# Patient Record
Sex: Male | Born: 1940 | Race: Black or African American | Hispanic: No | Marital: Married | Smoking: Former smoker
Health system: Southern US, Community
[De-identification: ages and names within clinical notes are randomized; demographics above are authoritative.]

---

## 2011-11-23 ENCOUNTER — Emergency Department: Payer: Self-pay | Admitting: Emergency Medicine

## 2013-01-06 ENCOUNTER — Emergency Department: Payer: Self-pay | Admitting: Emergency Medicine

## 2014-03-23 ENCOUNTER — Emergency Department: Payer: Self-pay | Admitting: Emergency Medicine

## 2016-01-12 ENCOUNTER — Emergency Department: Payer: Medicare Other

## 2016-01-12 ENCOUNTER — Encounter: Payer: Self-pay | Admitting: Emergency Medicine

## 2016-01-12 ENCOUNTER — Emergency Department
Admission: EM | Admit: 2016-01-12 | Discharge: 2016-01-12 | Disposition: A | Discharge status: Home / Self Care | Payer: Medicare Other | Attending: Student | Admitting: Student

## 2016-01-12 DIAGNOSIS — Z5181 Encounter for therapeutic drug level monitoring: Secondary | ICD-10-CM | POA: Insufficient documentation

## 2016-01-12 DIAGNOSIS — Z87891 Personal history of nicotine dependence: Secondary | ICD-10-CM | POA: Insufficient documentation

## 2016-01-12 DIAGNOSIS — R627 Adult failure to thrive: Secondary | ICD-10-CM | POA: Diagnosis present

## 2016-01-12 DIAGNOSIS — F1010 Alcohol abuse, uncomplicated: Secondary | ICD-10-CM | POA: Diagnosis not present

## 2016-01-12 LAB — CBC WITH DIFFERENTIAL/PLATELET
Basophils Absolute: 0 10*3/uL (ref 0–0.1)
Basophils Relative: 1 %
EOS PCT: 2 %
Eosinophils Absolute: 0.1 10*3/uL (ref 0–0.7)
HEMATOCRIT: 36.6 % — AB (ref 40.0–52.0)
HEMOGLOBIN: 12.8 g/dL — AB (ref 13.0–18.0)
Lymphocytes Relative: 12 %
Lymphs Abs: 1 10*3/uL (ref 1.0–3.6)
MCH: 35.2 pg — ABNORMAL HIGH (ref 26.0–34.0)
MCHC: 35 g/dL (ref 32.0–36.0)
MCV: 100.6 fL — AB (ref 80.0–100.0)
MONO ABS: 1 10*3/uL (ref 0.2–1.0)
Monocytes Relative: 12 %
Neutro Abs: 6.5 10*3/uL (ref 1.4–6.5)
Neutrophils Relative %: 75 %
Platelets: 222 10*3/uL (ref 150–440)
RBC: 3.64 MIL/uL — ABNORMAL LOW (ref 4.40–5.90)
RDW: 15.4 % — AB (ref 11.5–14.5)
WBC: 8.7 10*3/uL (ref 3.8–10.6)

## 2016-01-12 LAB — URINALYSIS COMPLETE WITH MICROSCOPIC (ARMC ONLY)
Bacteria, UA: NONE SEEN
Bilirubin Urine: NEGATIVE
Glucose, UA: NEGATIVE mg/dL
Ketones, ur: NEGATIVE mg/dL
LEUKOCYTES UA: NEGATIVE
Nitrite: NEGATIVE
PH: 5 (ref 5.0–8.0)
PROTEIN: NEGATIVE mg/dL
RBC / HPF: NONE SEEN RBC/hpf (ref 0–5)
SPECIFIC GRAVITY, URINE: 1.002 — AB (ref 1.005–1.030)
WBC UA: NONE SEEN WBC/hpf (ref 0–5)

## 2016-01-12 LAB — URINE DRUG SCREEN, QUALITATIVE (ARMC ONLY)
Amphetamines, Ur Screen: NOT DETECTED
BENZODIAZEPINE, UR SCRN: NOT DETECTED
Barbiturates, Ur Screen: NOT DETECTED
CANNABINOID 50 NG, UR ~~LOC~~: NOT DETECTED
Cocaine Metabolite,Ur ~~LOC~~: NOT DETECTED
MDMA (ECSTASY) UR SCREEN: NOT DETECTED
Methadone Scn, Ur: NOT DETECTED
Opiate, Ur Screen: NOT DETECTED
PHENCYCLIDINE (PCP) UR S: NOT DETECTED
TRICYCLIC, UR SCREEN: NOT DETECTED

## 2016-01-12 LAB — COMPREHENSIVE METABOLIC PANEL
ALT: 46 U/L (ref 17–63)
AST: 114 U/L — AB (ref 15–41)
Albumin: 4.3 g/dL (ref 3.5–5.0)
Alkaline Phosphatase: 100 U/L (ref 38–126)
Anion gap: 11 (ref 5–15)
BILIRUBIN TOTAL: 0.8 mg/dL (ref 0.3–1.2)
BUN: 6 mg/dL (ref 6–20)
CO2: 28 mmol/L (ref 22–32)
CREATININE: 1.05 mg/dL (ref 0.61–1.24)
Calcium: 9 mg/dL (ref 8.9–10.3)
Chloride: 92 mmol/L — ABNORMAL LOW (ref 101–111)
GFR calc Af Amer: >60 mL/min (ref >60)
GFR calc non Af Amer: >60 mL/min (ref >60)
Glucose, Bld: 84 mg/dL (ref 65–99)
POTASSIUM: 3.6 mmol/L (ref 3.5–5.1)
Sodium: 131 mmol/L — ABNORMAL LOW (ref 135–145)
TOTAL PROTEIN: 8.3 g/dL — AB (ref 6.5–8.1)

## 2016-01-12 LAB — ETHANOL: ALCOHOL ETHYL (B): 252 mg/dL — AB (ref <5)

## 2016-01-12 LAB — SALICYLATE LEVEL: Salicylate Lvl: <4 mg/dL (ref 2.8–30.0)

## 2016-01-12 LAB — ACETAMINOPHEN LEVEL: Acetaminophen (Tylenol), Serum: <10 ug/mL — ABNORMAL LOW (ref 10–30)

## 2016-01-12 NOTE — ED Notes (Signed)
Pt tolerated suctioning

## 2016-01-12 NOTE — Progress Notes (Signed)
Pt. Removed his trach. Per Dr. Joya SalmGayle's verbal order another #6 cuffless Shiley was inserted into his stoma. He was educated on the importance of keeping the trach in.
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
## 2016-01-12 NOTE — Progress Notes (Signed)
Pt. Came into the ED with a stoma. He said his trach came out a month ago and he was supposed to go to the Dr. For a new one but did not. A #6 cuffless shiley was placed with ease. The trach was suctioned for a lg. Amount of thick yellow secretions. The trach was secured with foam trach tie. His Sa02 was 97% on room air.

## 2016-01-12 NOTE — Consult Note (Signed)
[facility] Face-to-Face Psychiatry Consult   Reason for Consult:  Consult for this 75 year old man brought to the emergency room under involuntary commitment filed by Adult Protective Services Referring Physician:  Edd Fabian Patient Identification: Brent Lewis MRN:  [id number] Principal Diagnosis: Alcohol abuse Diagnosis:   Patient Active Problem List   Diagnosis Date Noted  . Alcohol abuse [F10.10] 01/12/2016    Total Time spent with patient: 1 hour  Subjective:   Brent Lewis is a 75 y.o. male patient admitted with "when can I go home".  HPI:  Patient interviewed. Patient was awake and alert and interactive. 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 He was able to answer yes and no questions consistently and was also able to write down some things spontaneously forming. Brought in under an involuntary commitment paper stating that he was residing in a home without utilities turned on and that he appeared to be doing a poor job taking care of himself and alleging that he was being exploited. On interview the patient denies any mood symptoms. Denies depression and sadness anxiety or anger. He denies any hallucinations. He indicates that he generally sleeps adequately. He acknowledges that the electricity is turned off at his home. He tells me that is because he was in jail for a period of time because of a DWI. He has 2 grandchildren who live with him. He says that he takes care of him. He admits that they ought to be working and taking care of themselves. Patient denies feeling like he is being exploited however. He says that they do get him food to eat. He admits that he drinks regularly. Says that he drinks about 2 40 ounce beers per day. This is about a standard stable amount for him. He indicates and ambivalence at best about wanting to stop drinking. Denies that he is using any other drugs. He acknowledges that his drinking is not good for his  health.  Social history: Patient has 2 grandchildren who live with him. He states that he gets a check and is able to write down the per Sy's dollar amount that he gets. Patient says he doesn't have his utilities turned on him because he was in jail for a while. He indicates that he is planning to get them turned back on. He admits that he has to "take care of" his 2 grandchildren who are in their 7s.  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 We don't have a lot of other detailed medical history.  Substance abuse history: Patient admits that he drinks and that he drinks enough to cause physical problems. He denies being aware of any history of seizures or delirium tremens. He denies ever engaging in any actual substance abuse treatment. He is not willing to commit firmly to the idea that he wants to stop drinking. Denies other drug use.  Past Psychiatric History: Patient denies any past psychiatric history. Says he's never been seen by a psychiatrist therapist or mental health provider. Never been in a psychiatric hospital. Never been prescribed any medicine for mood anxiety or other psychiatric problem. No history of suicidal behavior  Risk to Self: Is patient at risk for suicide?: No Risk to Others:   Prior Inpatient Therapy:   Prior Outpatient Therapy:    Past Medical History: History reviewed. No pertinent past medical history. History reviewed. No pertinent surgical history. Family History: History reviewed. No pertinent family history.  Family Psychiatric  History: Patient reports a family history of alcohol abuse Social History:  History  Alcohol use Not on file     History  Drug use: Unknown    Social History   Social History  . Marital status: Married    Spouse name: N/A  . Number of children: N/A  . Years of education: N/A   Social History Main Topics  . Smoking status: Former Research scientist (life sciences)  . Smokeless tobacco: Never Used  .  Alcohol use None  . Drug use: Unknown  . Sexual activity: Not Asked   Other Topics Concern  . None   Social History Narrative  . None   Additional Social History:    Allergies:  No Known Allergies  Labs:  Results for orders placed or performed during the hospital encounter of 01/12/16 (from the past 48 hour(s))  Comprehensive metabolic panel     Status: Abnormal   Collection Time: 01/12/16  3:42 PM  Result Value Ref Range   Sodium 131 (L) 135 - 145 mmol/L   Potassium 3.6 3.5 - 5.1 mmol/L   Chloride 92 (L) 101 - 111 mmol/L   CO2 28 22 - 32 mmol/L   Glucose, Bld 84 65 - 99 mg/dL   BUN 6 6 - 20 mg/dL   Creatinine, Ser 1.05 0.61 - 1.24 mg/dL   Calcium 9.0 8.9 - 10.3 mg/dL   Total Protein 8.3 (H) 6.5 - 8.1 g/dL   Albumin 4.3 3.5 - 5.0 g/dL   AST 114 (H) 15 - 41 U/L   ALT 46 17 - 63 U/L   Alkaline Phosphatase 100 38 - 126 U/L   Total Bilirubin 0.8 0.3 - 1.2 mg/dL   GFR calc non Af Amer >60 >60 mL/min   GFR calc Af Amer >60 >60 mL/min    Comment: (NOTE) The eGFR has been calculated using the CKD EPI equation. This calculation has not been validated in all clinical situations. eGFR's persistently <60 mL/min signify possible Chronic Kidney Disease.    Anion gap 11 5 - 15  Ethanol     Status: Abnormal   Collection Time: 01/12/16  3:42 PM  Result Value Ref Range   Alcohol, Ethyl (B) 252 (H) <5 mg/dL    Comment:        LOWEST DETECTABLE LIMIT FOR SERUM ALCOHOL IS 5 mg/dL FOR MEDICAL PURPOSES ONLY   CBC with Diff     Status: Abnormal   Collection Time: 01/12/16  3:42 PM  Result Value Ref Range   WBC 8.7 3.8 - 10.6 K/uL   RBC 3.64 (L) 4.40 - 5.90 MIL/uL   Hemoglobin 12.8 (L) 13.0 - 18.0 g/dL   HCT 36.6 (L) 40.0 - 52.0 %   MCV 100.6 (H) 80.0 - 100.0 fL   MCH 35.2 (H) 26.0 - 34.0 pg   MCHC 35.0 32.0 - 36.0 g/dL   RDW 15.4 (H) 11.5 - 14.5 %   Platelets 222 150 - 440 K/uL   Neutrophils Relative % 75 %   Neutro Abs 6.5 1.4 - 6.5 K/uL   Lymphocytes Relative 12 %    Lymphs Abs 1.0 1.0 - 3.6 K/uL   Monocytes Relative 12 %   Monocytes Absolute 1.0 0.2 - 1.0 K/uL   Eosinophils Relative 2 %   Eosinophils Absolute 0.1 0 - 0.7 K/uL   Basophils Relative 1 %   Basophils Absolute 0.0 0 - 0.1 K/uL  Salicylate level     Status: None   Collection Time: 01/12/16  3:42 PM  Result Value Ref Range   Salicylate Lvl <0.1 2.8 - 30.0 mg/dL  Acetaminophen level     Status: Abnormal   Collection Time: 01/12/16  3:42 PM  Result Value Ref Range   Acetaminophen (Tylenol), Serum <10 (L) 10 - 30 ug/mL    Comment:        THERAPEUTIC CONCENTRATIONS VARY SIGNIFICANTLY. A RANGE OF 10-30 ug/mL MAY BE AN EFFECTIVE CONCENTRATION FOR MANY PATIENTS. HOWEVER, SOME ARE BEST TREATED AT CONCENTRATIONS OUTSIDE THIS RANGE. ACETAMINOPHEN CONCENTRATIONS >150 ug/mL AT 4 HOURS AFTER INGESTION AND >50 ug/mL AT 12 HOURS AFTER INGESTION ARE OFTEN ASSOCIATED WITH TOXIC REACTIONS.   Urinalysis complete, with microscopic ([facility] only)     Status: Abnormal   Collection Time: 01/12/16  3:43 PM  Result Value Ref Range   Color, Urine STRAW (A) YELLOW   APPearance CLEAR (A) CLEAR   Glucose, UA NEGATIVE NEGATIVE mg/dL   Bilirubin Urine NEGATIVE NEGATIVE   Ketones, ur NEGATIVE NEGATIVE mg/dL   Specific Gravity, Urine 1.002 (L) 1.005 - 1.030   Hgb urine dipstick 2+ (A) NEGATIVE   pH 5.0 5.0 - 8.0   Protein, ur NEGATIVE NEGATIVE mg/dL   Nitrite NEGATIVE NEGATIVE   Leukocytes, UA NEGATIVE NEGATIVE   RBC / HPF NONE SEEN 0 - 5 RBC/hpf   WBC, UA NONE SEEN 0 - 5 WBC/hpf   Bacteria, UA NONE SEEN NONE SEEN   Squamous Epithelial / LPF 0-5 (A) NONE SEEN  Urine Drug Screen, Qualitative ([facility] only)     Status: None   Collection Time: 01/12/16  3:43 PM  Result Value Ref Range   Tricyclic, Ur Screen NONE DETECTED NONE DETECTED   Amphetamines, Ur Screen NONE DETECTED NONE DETECTED   MDMA (Ecstasy)Ur Screen NONE DETECTED NONE DETECTED   Cocaine Metabolite,Ur [place] NONE DETECTED NONE DETECTED   Opiate,  Ur Screen NONE DETECTED NONE DETECTED   Phencyclidine (PCP) Ur S NONE DETECTED NONE DETECTED   Cannabinoid 50 Ng, Ur [place] NONE DETECTED NONE DETECTED   Barbiturates, Ur Screen NONE DETECTED NONE DETECTED   Benzodiazepine, Ur Scrn NONE DETECTED NONE DETECTED   Methadone Scn, Ur NONE DETECTED NONE DETECTED    Comment: (NOTE) 027  Tricyclics, urine               Cutoff 1000 ng/mL 200  Amphetamines, urine             Cutoff 1000 ng/mL 300  MDMA (Ecstasy), urine           Cutoff 500 ng/mL 400  Cocaine Metabolite, urine       Cutoff 300 ng/mL 500  Opiate, urine                   Cutoff 300 ng/mL 600  Phencyclidine (PCP), urine      Cutoff 25 ng/mL 700  Cannabinoid, urine              Cutoff 50 ng/mL 800  Barbiturates, urine             Cutoff 200 ng/mL 900  Benzodiazepine, urine           Cutoff 200 ng/mL 1000 Methadone, urine                Cutoff 300 ng/mL 1100 1200 The urine drug screen provides only a preliminary, unconfirmed 1300 analytical test result and should not be used for non-medical 1400 purposes. Clinical consideration and professional judgment should 1500 be applied to any positive  drug screen result due to possible 1600 interfering substances. A more specific alternate chemical method 1700 must be used in order to obtain a confirmed analytical result.  1800 Gas chromato graphy / mass spectrometry (GC/MS) is the preferred 1900 confirmatory method.     No current facility-administered medications for this encounter.    No current outpatient prescriptions on file.    Musculoskeletal: Strength & Muscle Tone: within normal limits Gait & Station: normal Patient leans: N/A  Psychiatric Specialty Exam: Physical Exam  Nursing note and vitals reviewed. Constitutional: He appears well-developed.  HENT:  Head: Normocephalic and atraumatic.  Eyes: Conjunctivae are normal. Pupils are equal, round, and reactive to light.  Neck: Normal range of motion.  Cardiovascular: Regular  rhythm and normal heart sounds.   Respiratory: Effort normal.      GI: Soft.  Musculoskeletal: Normal range of motion.  Neurological: He is alert.  Skin: Skin is warm and dry.  Psychiatric: His behavior is normal. Judgment normal. His affect is blunt. Thought content is not paranoid. He expresses no homicidal and no suicidal ideation.  Patient is unable to speak but he was able to communicate with me by writing and by answering yes and no questions.    Review of Systems  Constitutional: Negative.   HENT: Negative.   Eyes: Negative.   Respiratory: Negative.   Cardiovascular: Negative.   Gastrointestinal: Negative.   Musculoskeletal: Negative.   Skin: Negative.   Neurological: Negative.   Psychiatric/Behavioral: Negative for depression, hallucinations, memory loss, substance abuse and suicidal ideas. The patient is not nervous/anxious and does not have insomnia.     Blood pressure 140/80, pulse 77, temperature 98.6 F (37 C), temperature source Oral, resp. rate 16, height 6' 1"  (1.854 m), weight 77.1 kg (170 lb), SpO2 95 %.Body mass index is 22.43 kg/m.  General Appearance: Casual  Eye Contact:  Good  Speech:  Speech is essentially nonexistent but he is able to communicate as noted above  Volume:  Decreased  Mood:  Euthymic  Affect:  Congruent  Thought Process:  Goal Directed  Orientation:  Full (Time, Place, and Person)  Thought Content:  Logical  Suicidal Thoughts:  No  Homicidal Thoughts:  No  Memory:  Immediate;   Good Recent;   Fair Remote;   Fair  Judgement:  Fair  Insight:  Fair  Psychomotor Activity:  Normal  Concentration:  Concentration: Fair  Recall:  [employer] of Knowledge:  Fair  Language:  Poor  Akathisia:  No  Handed:  Right  AIMS (if indicated):     Assets:  Financial Resources/Insurance Housing Social Support  ADL's:  Intact  Cognition:  WNL  Sleep:        Treatment Plan Summary: Plan 75 year old man without a past psychiatric history.  History of alcohol abuse. Presented to the emergency room intoxicated. Did not appear to be having any alcohol withdrawal symptoms. Was not acting in a disruptive manner. He was able to engage in a basic conversation with yes and no questions and some writing. There is no indication that the patient suffers from depression or psychosis. I was not able to go into enough detail to say whether he may or may not have a mild degree of dementia but he is alert and oriented currently. Patient does not have commitment criteria. There is no indication of current dangerousness. Is only identifiable mental health issue is alcohol abuse. Involuntary commitment is discontinued. Case reviewed with emergency room team.  Disposition: Patient does not  meet criteria for psychiatric inpatient admission. Supportive therapy provided about ongoing stressors. Patient does not wish to be admitted to the hospital and asked very clearly to go home. He did listen attentively and respond to discussion about the medical problems that are associated with his alcohol abuse. He will be given information about local substance abuse resources.  Alethia Berthold, MD 01/12/2016 7:04 PM
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
## 2016-01-12 NOTE — ED Notes (Signed)
DSS filed paperwork on patient prior to arrival; on chart

## 2016-01-12 NOTE — ED Notes (Addendum)
Notified adult protective services; to provide f/u. Spoke with Deloris PingHunter Walls

## 2016-01-12 NOTE — ED Notes (Signed)
During d/c process, pt took out trach a 2nd time. Pt was told to keep trach twice before. Pt alert and oriented. Said he understand. Nurse found pt without trach.

## 2016-01-12 NOTE — ED Triage Notes (Addendum)
Pt presents to ED accompanied by 2 police with IVC paper filed by DSS for failure to thrive. Pt live in the house with no food or power. Pt speaks through a tract but lost the tract. Pt denies SI/HI at this time.

## 2016-01-12 NOTE — ED Notes (Signed)
Pt tolerated suctioning 

## 2016-01-12 NOTE — ED Notes (Signed)
Pt picked up by cousin Charlean MerlGilbert Moore. MD spoke with family earlier about d/c process. Mr. Charlean MerlGilbert Moore reported password to nurse prior to discharge.

## 2017-07-14 ENCOUNTER — Emergency Department: Payer: Medicare Other

## 2017-07-14 ENCOUNTER — Inpatient Hospital Stay
Admission: EM | Admit: 2017-07-14 | Discharge: 2017-07-25 | DRG: 871 | Disposition: E | Payer: Medicare Other | Attending: Internal Medicine | Admitting: Internal Medicine

## 2017-07-14 ENCOUNTER — Inpatient Hospital Stay: Payer: Medicare Other

## 2017-07-14 DIAGNOSIS — I469 Cardiac arrest, cause unspecified: Secondary | ICD-10-CM

## 2017-07-14 DIAGNOSIS — J9600 Acute respiratory failure, unspecified whether with hypoxia or hypercapnia: Secondary | ICD-10-CM

## 2017-07-14 DIAGNOSIS — R627 Adult failure to thrive: Secondary | ICD-10-CM | POA: Diagnosis present

## 2017-07-14 DIAGNOSIS — R680 Hypothermia, not associated with low environmental temperature: Secondary | ICD-10-CM | POA: Diagnosis present

## 2017-07-14 DIAGNOSIS — A419 Sepsis, unspecified organism: Principal | ICD-10-CM | POA: Diagnosis present

## 2017-07-14 DIAGNOSIS — Z79899 Other long term (current) drug therapy: Secondary | ICD-10-CM

## 2017-07-14 DIAGNOSIS — J440 Chronic obstructive pulmonary disease with acute lower respiratory infection: Secondary | ICD-10-CM | POA: Diagnosis present

## 2017-07-14 DIAGNOSIS — G9341 Metabolic encephalopathy: Secondary | ICD-10-CM | POA: Diagnosis present

## 2017-07-14 DIAGNOSIS — J189 Pneumonia, unspecified organism: Secondary | ICD-10-CM | POA: Diagnosis present

## 2017-07-14 DIAGNOSIS — Z87891 Personal history of nicotine dependence: Secondary | ICD-10-CM | POA: Diagnosis not present

## 2017-07-14 DIAGNOSIS — E162 Hypoglycemia, unspecified: Secondary | ICD-10-CM | POA: Diagnosis not present

## 2017-07-14 DIAGNOSIS — E43 Unspecified severe protein-calorie malnutrition: Secondary | ICD-10-CM | POA: Diagnosis present

## 2017-07-14 DIAGNOSIS — E039 Hypothyroidism, unspecified: Secondary | ICD-10-CM | POA: Diagnosis present

## 2017-07-14 DIAGNOSIS — N170 Acute kidney failure with tubular necrosis: Secondary | ICD-10-CM | POA: Diagnosis present

## 2017-07-14 DIAGNOSIS — Z681 Body mass index (BMI) 19 or less, adult: Secondary | ICD-10-CM

## 2017-07-14 DIAGNOSIS — F1010 Alcohol abuse, uncomplicated: Secondary | ICD-10-CM | POA: Diagnosis present

## 2017-07-14 DIAGNOSIS — Z9002 Acquired absence of larynx: Secondary | ICD-10-CM

## 2017-07-14 DIAGNOSIS — Z79890 Hormone replacement therapy (postmenopausal): Secondary | ICD-10-CM

## 2017-07-14 DIAGNOSIS — E860 Dehydration: Secondary | ICD-10-CM | POA: Diagnosis present

## 2017-07-14 DIAGNOSIS — J9601 Acute respiratory failure with hypoxia: Secondary | ICD-10-CM | POA: Diagnosis not present

## 2017-07-14 DIAGNOSIS — I4901 Ventricular fibrillation: Secondary | ICD-10-CM | POA: Diagnosis not present

## 2017-07-14 DIAGNOSIS — R0602 Shortness of breath: Secondary | ICD-10-CM

## 2017-07-14 DIAGNOSIS — Z8521 Personal history of malignant neoplasm of larynx: Secondary | ICD-10-CM | POA: Diagnosis not present

## 2017-07-14 DIAGNOSIS — Z930 Tracheostomy status: Secondary | ICD-10-CM

## 2017-07-14 DIAGNOSIS — N289 Disorder of kidney and ureter, unspecified: Secondary | ICD-10-CM | POA: Diagnosis not present

## 2017-07-14 DIAGNOSIS — R6521 Severe sepsis with septic shock: Secondary | ICD-10-CM | POA: Diagnosis not present

## 2017-07-14 DIAGNOSIS — I10 Essential (primary) hypertension: Secondary | ICD-10-CM | POA: Diagnosis present

## 2017-07-14 DIAGNOSIS — D696 Thrombocytopenia, unspecified: Secondary | ICD-10-CM | POA: Diagnosis present

## 2017-07-14 LAB — CBC WITH DIFFERENTIAL/PLATELET
BASOS ABS: 0 10*3/uL (ref 0–0.1)
BASOS PCT: 0 %
Basophils Absolute: 0.1 10*3/uL (ref 0–0.1)
Basophils Relative: 0 %
EOS ABS: 0 10*3/uL (ref 0–0.7)
EOS PCT: 0 %
Eosinophils Absolute: 0 10*3/uL (ref 0–0.7)
Eosinophils Relative: 0 %
HCT: 37.4 % — ABNORMAL LOW (ref 40.0–52.0)
HEMATOCRIT: 41.8 % (ref 40.0–52.0)
HEMOGLOBIN: 13.6 g/dL (ref 13.0–18.0)
Hemoglobin: 12 g/dL — ABNORMAL LOW (ref 13.0–18.0)
LYMPHS ABS: 0.4 10*3/uL — AB (ref 1.0–3.6)
LYMPHS PCT: 2 %
Lymphocytes Relative: 2 %
Lymphs Abs: 0.4 10*3/uL — ABNORMAL LOW (ref 1.0–3.6)
MCH: 30.3 pg (ref 26.0–34.0)
MCH: 30.4 pg (ref 26.0–34.0)
MCHC: 32.5 g/dL (ref 32.0–36.0)
MCHC: 32.2 g/dL (ref 32.0–36.0)
MCV: 93.4 fL (ref 80.0–100.0)
MCV: 94.6 fL (ref 80.0–100.0)
MONO ABS: 2 10*3/uL — AB (ref 0.2–1.0)
MONOS PCT: 8 %
Monocytes Absolute: 1.7 10*3/uL — ABNORMAL HIGH (ref 0.2–1.0)
Monocytes Relative: 10 %
NEUTROS ABS: 20.2 10*3/uL — AB (ref 1.4–6.5)
NEUTROS PCT: 90 %
Neutro Abs: 17.3 10*3/uL — ABNORMAL HIGH (ref 1.4–6.5)
Neutrophils Relative %: 88 %
PLATELETS: 64 10*3/uL — AB (ref 150–440)
Platelets: 82 10*3/uL — ABNORMAL LOW (ref 150–440)
RBC: 4.48 MIL/uL (ref 4.40–5.90)
RBC: 3.96 MIL/uL — ABNORMAL LOW (ref 4.40–5.90)
RDW: 18.3 % — ABNORMAL HIGH (ref 11.5–14.5)
RDW: 18.7 % — AB (ref 11.5–14.5)
WBC: 22.4 10*3/uL — ABNORMAL HIGH (ref 3.8–10.6)
WBC: 19.7 10*3/uL — AB (ref 3.8–10.6)

## 2017-07-14 LAB — PROTIME-INR
INR: 2.32
PROTHROMBIN TIME: 25.3 s — AB (ref 11.4–15.2)

## 2017-07-14 LAB — COMPREHENSIVE METABOLIC PANEL
ALT: 52 U/L (ref 17–63)
ALT: 48 U/L (ref 17–63)
ANION GAP: 13 (ref 5–15)
AST: 125 U/L — ABNORMAL HIGH (ref 15–41)
AST: 118 U/L — AB (ref 15–41)
Albumin: 2.5 g/dL — ABNORMAL LOW (ref 3.5–5.0)
Albumin: 2.2 g/dL — ABNORMAL LOW (ref 3.5–5.0)
Alkaline Phosphatase: 292 U/L — ABNORMAL HIGH (ref 38–126)
Alkaline Phosphatase: 224 U/L — ABNORMAL HIGH (ref 38–126)
Anion gap: 12 (ref 5–15)
BUN: 56 mg/dL — ABNORMAL HIGH (ref 6–20)
BUN: 45 mg/dL — AB (ref 6–20)
CHLORIDE: 98 mmol/L — AB (ref 101–111)
CHLORIDE: 102 mmol/L (ref 101–111)
CO2: 26 mmol/L (ref 22–32)
CO2: 22 mmol/L (ref 22–32)
CREATININE: 1.76 mg/dL — AB (ref 0.61–1.24)
Calcium: 10.4 mg/dL — ABNORMAL HIGH (ref 8.9–10.3)
Calcium: 8.7 mg/dL — ABNORMAL LOW (ref 8.9–10.3)
Creatinine, Ser: 1.34 mg/dL — ABNORMAL HIGH (ref 0.61–1.24)
GFR calc Af Amer: 57 mL/min — ABNORMAL LOW (ref >60)
GFR calc non Af Amer: 36 mL/min — ABNORMAL LOW (ref >60)
GFR calc non Af Amer: 49 mL/min — ABNORMAL LOW (ref >60)
GFR, EST AFRICAN AMERICAN: 41 mL/min — AB (ref >60)
Glucose, Bld: 143 mg/dL — ABNORMAL HIGH (ref 65–99)
Glucose, Bld: 106 mg/dL — ABNORMAL HIGH (ref 65–99)
POTASSIUM: 4.1 mmol/L (ref 3.5–5.1)
POTASSIUM: 3.7 mmol/L (ref 3.5–5.1)
SODIUM: 137 mmol/L (ref 135–145)
SODIUM: 136 mmol/L (ref 135–145)
Total Bilirubin: 10.5 mg/dL — ABNORMAL HIGH (ref 0.3–1.2)
Total Bilirubin: 9.5 mg/dL — ABNORMAL HIGH (ref 0.3–1.2)
Total Protein: 7.9 g/dL (ref 6.5–8.1)
Total Protein: 7.1 g/dL (ref 6.5–8.1)

## 2017-07-14 LAB — URINALYSIS, COMPLETE (UACMP) WITH MICROSCOPIC
Bacteria, UA: NONE SEEN
Bilirubin Urine: NEGATIVE
Glucose, UA: NEGATIVE mg/dL
Hgb urine dipstick: NEGATIVE
Ketones, ur: NEGATIVE mg/dL
Leukocytes, UA: NEGATIVE
Nitrite: NEGATIVE
PROTEIN: NEGATIVE mg/dL
SQUAMOUS EPITHELIAL / LPF: NONE SEEN
Specific Gravity, Urine: 1.016 (ref 1.005–1.030)
pH: 5 (ref 5.0–8.0)

## 2017-07-14 LAB — LACTIC ACID, PLASMA
Lactic Acid, Venous: 4 mmol/L (ref 0.5–1.9)
Lactic Acid, Venous: 3 mmol/L (ref 0.5–1.9)

## 2017-07-14 LAB — APTT: APTT: 40 s — AB (ref 24–36)

## 2017-07-14 LAB — TROPONIN I: Troponin I: 0.04 ng/mL (ref <0.03)

## 2017-07-14 LAB — URINE DRUG SCREEN, QUALITATIVE (ARMC ONLY)
Amphetamines, Ur Screen: NOT DETECTED
BARBITURATES, UR SCREEN: NOT DETECTED
Benzodiazepine, Ur Scrn: NOT DETECTED
COCAINE METABOLITE, UR ~~LOC~~: NOT DETECTED
Cannabinoid 50 Ng, Ur ~~LOC~~: NOT DETECTED
MDMA (ECSTASY) UR SCREEN: NOT DETECTED
METHADONE SCREEN, URINE: NOT DETECTED
Opiate, Ur Screen: NOT DETECTED
Phencyclidine (PCP) Ur S: NOT DETECTED
TRICYCLIC, UR SCREEN: NOT DETECTED

## 2017-07-14 LAB — INFLUENZA PANEL BY PCR (TYPE A & B)
Influenza A By PCR: NEGATIVE
Influenza B By PCR: NEGATIVE

## 2017-07-14 LAB — PROCALCITONIN: PROCALCITONIN: 16.62 ng/mL

## 2017-07-14 MED ORDER — PIPERACILLIN-TAZOBACTAM 3.375 G IVPB 30 MIN: 3.3750 g | Freq: Once | INTRAVENOUS | Status: DC

## 2017-07-14 MED ORDER — VANCOMYCIN HCL IN DEXTROSE 1-5 GM/200ML-% IV SOLN
1000.0000 mg | Freq: Once | INTRAVENOUS | Status: AC
  Administered 2017-07-14: 1000 mg via INTRAVENOUS
  Filled 2017-07-14: qty 200

## 2017-07-14 MED ORDER — VANCOMYCIN HCL IN DEXTROSE 1-5 GM/200ML-% IV SOLN: 1000.0000 mg | Freq: Once | INTRAVENOUS | Status: DC

## 2017-07-14 MED ORDER — PIPERACILLIN-TAZOBACTAM 3.375 G IVPB 30 MIN
3.3750 g | Freq: Once | INTRAVENOUS | Status: AC
  Administered 2017-07-14: 3.375 g via INTRAVENOUS
  Filled 2017-07-14: qty 50

## 2017-07-14 MED ORDER — ACETAMINOPHEN 650 MG RE SUPP: 650.0000 mg | Freq: Four times a day (QID) | RECTAL | Status: DC | PRN

## 2017-07-14 MED ORDER — SODIUM CHLORIDE 0.9 % IV BOLUS (SEPSIS)
1000.0000 mL | Freq: Once | INTRAVENOUS | Status: AC
  Administered 2017-07-14: 1000 mL via INTRAVENOUS

## 2017-07-14 MED ORDER — SODIUM CHLORIDE 0.9 % IV BOLUS (SEPSIS)
500.0000 mL | Freq: Once | INTRAVENOUS | Status: AC
  Administered 2017-07-14: 500 mL via INTRAVENOUS

## 2017-07-14 MED ORDER — SODIUM CHLORIDE 0.9 % IV SOLN: INTRAVENOUS | Status: DC

## 2017-07-14 MED ORDER — PANTOPRAZOLE SODIUM 40 MG IV SOLR
40.0000 mg | INTRAVENOUS | Status: DC
  Administered 2017-07-15 – 2017-07-16 (×2): 40 mg via INTRAVENOUS
  Filled 2017-07-14 (×2): qty 40

## 2017-07-14 MED ORDER — ENOXAPARIN SODIUM 40 MG/0.4ML ~~LOC~~ SOLN: 30.0000 mg | SUBCUTANEOUS | Status: DC

## 2017-07-14 MED ORDER — DIPHENHYDRAMINE HCL 50 MG/ML IJ SOLN
25.0000 mg | Freq: Once | INTRAMUSCULAR | Status: AC | PRN
  Administered 2017-07-14: 25 mg via INTRAVENOUS
  Filled 2017-07-14: qty 1

## 2017-07-14 MED ORDER — ONDANSETRON HCL 4 MG/2ML IJ SOLN: 4.0000 mg | Freq: Four times a day (QID) | INTRAMUSCULAR | Status: DC | PRN

## 2017-07-14 MED ORDER — PIPERACILLIN-TAZOBACTAM 3.375 G IVPB
3.3750 g | Freq: Three times a day (TID) | INTRAVENOUS | Status: DC
  Administered 2017-07-14 – 2017-07-16 (×7): 3.375 g via INTRAVENOUS
  Filled 2017-07-14 (×7): qty 50

## 2017-07-14 MED ORDER — SODIUM CHLORIDE 0.9 % IV SOLN
INTRAVENOUS | Status: AC
  Administered 2017-07-14: 21:00:00 via INTRAVENOUS

## 2017-07-14 MED ORDER — VANCOMYCIN HCL IN DEXTROSE 1-5 GM/200ML-% IV SOLN
1000.0000 mg | INTRAVENOUS | Status: DC
  Administered 2017-07-14 – 2017-07-15 (×2): 1000 mg via INTRAVENOUS
  Filled 2017-07-14 (×3): qty 200

## 2017-07-14 MED ORDER — HALOPERIDOL LACTATE 5 MG/ML IJ SOLN
1.0000 mg | Freq: Once | INTRAMUSCULAR | Status: AC
  Administered 2017-07-14: 1 mg via INTRAVENOUS
  Filled 2017-07-14: qty 0.2

## 2017-07-14 NOTE — ED Notes (Signed)
Patient transported to 216 

## 2017-07-14 NOTE — ED Notes (Signed)
[name] is faxing over contact information. Pt is confirmed ward of the state.

## 2017-07-14 NOTE — ED Notes (Signed)
Nurse attempting to reach SW. [facility] social worker at [phone]. Unable to reach and attempted to call a supervisor.

## 2017-07-14 NOTE — ED Triage Notes (Signed)
Pt brought in via [facility] from a [place] of the state home on [address] Pt per EMS was sent here for labs that were unable to be performed yesterday to pt possibly being dehydrated. Pt's pulse O2 for EMS on arrival was 85% on RA. Pt has a previous trachostomy opening that has some yellow drainage around. EMS stating that they suctioned him

## 2017-07-14 NOTE — Progress Notes (Signed)
Pharmacy Antibiotic Note  Magnus SinningLacy Hildenbrand is a 77 y.o. male admitted on 16-Jul-2017 with sepsis.  Pharmacy has been consulted for vancomycin + piperacillin/tazobactam dosing.  Wt = 77 kg per RN. CrCl 38 mL/min  Plan: Piperacillin/tazobactam 3.375 g IV q8h EI  Vancomycin 1000 mg IV given in ED. Will order vancomycin 1000 mg IV q24h (6 hr stack) Goal VT 15-20 mcg/mL  Kinetics: Ke: 0.035 Half-life: 20 hrs Cmin ~15 mcg/mL    Temp (24hrs), Avg:96.9 F (36.1 C), Min:96.4 F (35.8 C), Max:97.4 F (36.3 C)  Recent Labs  Lab 02/13/2018 1310 02/13/2018 1535  WBC 22.4*  --   CREATININE 1.76*  --   LATICACIDVEN  --  4.0*    CrCl cannot be calculated (Unknown ideal weight.).    No Known Allergies   Antimicrobials this admission: vancomycin 3/21 >>  Piperacillin/tazobactam 3/21 >>   Dose adjustments this admission:  Microbiology results: 3/21 BCx: Sent  Thank you for allowing pharmacy to be a part of this patient's care.  Cindi CarbonMary M Rider Ermis, PharmD, BCPS Clinical Pharmacist 16-Jul-2017 7:42 PM

## 2017-07-14 NOTE — ED Notes (Signed)
Dr. Ann LionsSiedeski has removed pt from the 2L of o2 he was receiving due to him being at 100%. Pt tolerating RA well at this time.

## 2017-07-14 NOTE — ED Notes (Signed)
Lab was notified that pt has already been stuck 4 times for PIV access and blood work. Lab unable to provide tech at this time for blood draw.

## 2017-07-14 NOTE — H&P (Signed)
[facility] [place] [facility]   PATIENT NAME: Brent Lewis    MR#:  [id number]  DATE OF BIRTH:  1940-12-27  DATE OF ADMISSION:  07/06/2017  PRIMARY CARE PHYSICIAN: Patient, No Pcp Per   REQUESTING/REFERRING PHYSICIAN: Dr. Rubin PayorSiddiqui  CHIEF COMPLAINT:   Altered mental status HISTORY OF PRESENT ILLNESS:  Brent Lewis  is a 77 y.o. male with a known history of chronic tracheostomy is brought into the emergency department as before he was altered.  Patient is ward of state and initially he was hypoxemic but chest x-ray has revealed COPD changes but no acute infiltrates. Patient has leukocytosis and elevated lactic acid levels, pan cultures were obtained and patient is started on broad-spectrum IV antibiotics.  Hypoxemia resolved after suctioning tracheostomy site and currently patient is on room air.  PAST MEDICAL HISTORY:  Chronic tracheostomy  PAST SURGICAL HISTOIRY:  Chronic tracheostomy  SOCIAL HISTORY:   Old records reveal alcohol abuse currently patient is unresponsive  FAMILY HISTORY:  Unobtainable as the patient is unresponsive  DRUG ALLERGIES:  No Known Allergies  REVIEW OF SYSTEMS:  Review of system unobtainable as the patient is delirious  MEDICATIONS AT HOME:   Prior to Admission medications   Not on File      VITAL SIGNS:  Blood pressure (!) 139/98, pulse (!) 125, temperature (!) 96.4 F (35.8 C), temperature source Rectal, resp. rate 18, SpO2 (!) 81 %.  PHYSICAL EXAMINATION:  GENERAL:  77 y.o.-year-old patient lying in the bed with no acute distress.  EYES: Pupils equal, round, reactive to light and accommodation. No scleral icterus. Extraocular muscles intact.  HEENT: Head atraumatic, normocephalic. Oropharynx and nasopharynx clear.  NECK:  Supple, no jugular venous distention. No thyroid enlargement, no tenderness.  Tracheostomy site is intact LUNGS: Normal breath sounds bilaterally, no wheezing, rales,rhonchi or  crepitation. No use of accessory muscles of respiration.  CARDIOVASCULAR: S1, S2 normal. No murmurs, rubs, or gallops.  ABDOMEN: Soft, nontender, nondistended. Bowel sounds present.  EXTREMITIES: No pedal edema, cyanosis, or clubbing.  NEUROLOGIC: Patient is encephalopathic, partially opens eyes to verbal commands but not answering any questions PSYCHIATRIC: The patient is encephalopathic  SKIN: No obvious rash, lesion, or ulcer.   LABORATORY PANEL:   CBC Recent Labs  Lab 07/12/2017 1310  WBC 22.4*  HGB 13.6  HCT 41.8  PLT 82*   ------------------------------------------------------------------------------------------------------------------  Chemistries  Recent Labs  Lab 07/10/2017 1310  NA 137  K 4.1  CL 98*  CO2 26  GLUCOSE 143*  BUN 56*  CREATININE 1.76*  CALCIUM 10.4*  AST 125*  ALT 52  ALKPHOS 292*  BILITOT 10.5*   ------------------------------------------------------------------------------------------------------------------  Cardiac Enzymes Recent Labs  Lab 07/20/2017 1310  TROPONINI 0.04*   ------------------------------------------------------------------------------------------------------------------  RADIOLOGY:  Dg Chest 2 View  Result Date: 07/13/2017 CLINICAL DATA:  Possible hypoxia EXAM: CHEST - 2 VIEW COMPARISON:  01/12/2016 FINDINGS: Tracheostomy tube is been removed in the interval. Severe COPD with hyperinflation. Negative for heart failure or pneumonia. Mild bibasilar scarring. Negative for pleural effusion IMPRESSION: Severe COPD. Bibasilar scarring without acute cardiopulmonary abnormality. Electronically Signed   By: Marlan Palauharles  Clark M.D.   On: 07/08/2017 13:54    EKG:   Orders placed or performed during the hospital encounter of 07/24/2017  . EKG 12-Lead  . EKG 12-Lead    IMPRESSION AND PLAN:   Brent Lewis  is a 77 y.o. male with a known history of chronic tracheostomy is brought into the emergency department as before he  was altered.   Patient is ward of state and initially he was hypoxemic but chest x-ray has revealed COPD changes but no acute infiltrates. Patient has leukocytosis and elevated lactic acid levels, pan cultures were obtained and patient is started on broad-spectrum IV antibiotics  #Acute metabolic encephalopathy from sepsis with unclear source Admit to MedSurg unit Blood and urine cultures were ordered Chest x-ray negative Patient meets septic criteria at the time of admission with leukocytosis and elevated lactic acid level at 4.0 Monitor lactic acid level and check pro-calcitonin Patient is started on broad-spectrum antibiotic Zosyn and vancomycin Neurochecks   #Acute hypoxia with chronic tracheostomy After suctioning trach hypoxia resolved Continue trach suctioning and trach care as needed  #AKI from dehydration Hydrate with IV fluids and avoid nephrotoxins and monitor renal function If no improvement will consider nephrology consult and renal ultrasound  #History of alcohol abuse-according to old records Check alcohol level and urine drug screen #thrombocytopenia  #Thrombocytopenia Source could be from the sepsis versus probable alcohol abuse No active bleeding or bruises noticed.   Provide GI prophylaxis with Protonix and DVT prophylaxis with SCD  All the records are reviewed and case discussed with ED provider. Management plans discussed with the patient, family and they are in agreement.  CODE STATUS: FC, patient is  award of state  TOTAL TIME TAKING CARE OF THIS PATIENT: 45 minutes.   Note: This dictation was prepared with Dragon dictation along with smaller phrase technology. Any transcriptional errors that result from this process are unintentional.  Ramonita Lab M.D on 06/24/2017 at 5:36 PM  Between 7am to 6pm - Pager - [phone]  After 6pm go to [email or web address] - password [email or web address]  [facility]  Office  [phone]  CC: Primary care physician; Patient, No  Pcp Per

## 2017-07-14 NOTE — ED Notes (Signed)
Andree MoroLatonya Hall # [phone]  [facility] [place] Social Work: # [phone]  [facility] - [phone]

## 2017-07-14 NOTE — ED Notes (Signed)
Patient transported to X-ray 

## 2017-07-14 NOTE — ED Provider Notes (Signed)
[facility] Emergency Department Provider Note ____________________________________________   None    (approximate)  I have reviewed the triage vital signs and the nursing notes.   HISTORY  Chief Complaint Dehydration  Level 5 caveat: History of present illness limited due to tracheostomy and difficulty speaking  HPI Brent Lewis is a 77 y.o. male with unknown PMH who is apparently a ward of the state.  Per EMS, the patient was apparently sent to the emergency department to have labs done which were attempted to be done there yesterday.  EMS also noted that the patient was hypoxic to 85% on room air when they arrived.  The patient has a tracheostomy and had some yellowish discharge around it.  The patient is somewhat communicative and is able to answer yes and no.  He denies having any pain or shortness of breath.  No past medical history on file.  Patient Active Problem List   Diagnosis Date Noted  . Alcohol abuse 01/12/2016    No past surgical history on file.  Prior to Admission medications   Not on File    Allergies Patient has no known allergies.  No family history on file.  Social History Social History   Tobacco Use  . Smoking status: Former Games developermoker  . Smokeless tobacco: Never Used  Substance Use Topics  . Alcohol use: Not on file  . Drug use: Not on file    Review of Systems Level 5 caveat: Unable to obtain review of systems due to tracheostomy and difficulty speaking    ____________________________________________   PHYSICAL EXAM:  VITAL SIGNS: ED Triage Vitals  Enc Vitals Group     BP Oct 23, 2017 1330 (!) 139/98     Pulse Rate Oct 23, 2017 1330 (!) 107     Resp Oct 23, 2017 1330 20     Temp Oct 23, 2017 1330 (!) 96.4 F (35.8 C)     Temp Source Oct 23, 2017 1330 Rectal     SpO2 Oct 23, 2017 1329 (!) 89 %     Weight --      Height --      Head Circumference --      Peak Flow --      Pain Score Oct 23, 2017 1330 0     Pain Loc --    Pain Edu? --      Excl. in GC? --     Constitutional: Alert, and able to faintly state his name and that he is in the hospital. Eyes: Conjunctivae are normal.  EOMI.  PERRLA. Head: Atraumatic. Nose: No congestion/rhinnorhea. Mouth/Throat: Mucous membranes are dry.   Neck: Normal range of motion.  Cardiovascular: Normal rate, regular rhythm. Grossly normal heart sounds.  Good peripheral circulation. Respiratory: Normal respiratory effort.  No retractions.  Slightly coarse breath sounds but no wheezes or rales. Gastrointestinal: Soft and nontender. No distention.  Genitourinary: No flank tenderness. Musculoskeletal: No lower extremity edema.  Extremities warm and well perfused.  Neurologic: Motor intact in all extremities. Skin:  Skin is warm and dry. No rash noted. Psychiatric: Unable to assess.  ____________________________________________   LABS (all labs ordered are listed, but only abnormal results are displayed)  Labs Reviewed  COMPREHENSIVE METABOLIC PANEL - Abnormal; Notable for the following components:      Result Value   Chloride 98 (*)    Glucose, Bld 143 (*)    BUN 56 (*)    Creatinine, Ser 1.76 (*)    Calcium 10.4 (*)    Albumin 2.5 (*)    AST 125 (*)  Alkaline Phosphatase 292 (*)    Total Bilirubin 10.5 (*)    GFR calc non Af Amer 36 (*)    GFR calc Af Amer 41 (*)    All other components within normal limits  CBC WITH DIFFERENTIAL/PLATELET - Abnormal; Notable for the following components:   WBC 22.4 (*)    RDW 18.3 (*)    Platelets 82 (*)    Neutro Abs 20.2 (*)    Lymphs Abs 0.4 (*)    Monocytes Absolute 1.7 (*)    All other components within normal limits  TROPONIN I - Abnormal; Notable for the following components:   Troponin I 0.04 (*)    All other components within normal limits  URINALYSIS, COMPLETE (UACMP) WITH MICROSCOPIC - Abnormal; Notable for the following components:   Color, Urine AMBER (*)    APPearance HAZY (*)    All other components  within normal limits  LACTIC ACID, PLASMA - Abnormal; Notable for the following components:   Lactic Acid, Venous 4.0 (*)    All other components within normal limits  CULTURE, BLOOD (ROUTINE X 2)  CULTURE, BLOOD (ROUTINE X 2)  LACTIC ACID, PLASMA  INFLUENZA PANEL BY PCR (TYPE A & B)   ____________________________________________  EKG  ED ECG REPORT I, Dionne Bucy, the attending physician, personally viewed and interpreted this ECG.  Date: 07/04/2017 EKG Time: 1252 Rate: 101 Rhythm: Borderline sinus tachycardia QRS Axis: normal Intervals: Incomplete RBBB ST/T Wave abnormalities: normal Narrative Interpretation: no evidence of acute ischemia  ____________________________________________  RADIOLOGY  CXR: COPD with no focal infiltrate  ____________________________________________   PROCEDURES  Procedure(s) performed: No  Procedures  Critical Care performed: No ____________________________________________   INITIAL IMPRESSION / ASSESSMENT AND PLAN / ED COURSE  Pertinent labs & imaging results that were available during my care of the patient were reviewed by me and considered in my medical decision making (see chart for details).  77 year old male who is a ward of the state presents for somewhat unclear reasons, but apparently for lab workup (unclear if these were routine labs which were unable to be performed outpatient or due to a specific change in his clinical status).  He was noted to be hypoxic and to have secretions around his tracheostomy site by EMS.  In the ED, the patient has borderline tachycardia and O2 sat after suctioning and cleaning of the tracheostomy site is 100% on room air.  His temperature is slightly low but the remainder the vital signs are are normal.  The patient is alert and comfortable appearing.  Neuro exam is nonfocal.  There is no evidence of trauma.  Since it is unclear exactly what the indication for making the patient is  emergency department was, will obtain basic labs.  I suspect the hypoxia is most likely due to secretions around the tracheostomy site given that it is now resolved, but we will obtain a chest x-ray and EKG as well.  Anticipate discharge back to the facility if the workup is within normal limits. Clinical Course as of Jul 14 1712  Thu Jul 14, 2017  1430 Comprehensive metabolic panel(!) [SS]    Clinical Course User Index [SS] Dionne Bucy, MD   ----------------------------------------- 4:43 PM on 07/24/2017 -----------------------------------------  Patient's initial workup revealed elevated white blood cell count, and given this finding as well as his vital signs I began concern for possible sepsis, which was not on the differential initially.  Lactic acid was obtained and is elevated consistent with sepsis.  Patient's chest  x-ray does not show focal infiltrate, and the UA is negative however I suspect respiratory/ache related to infectious most likely source.  The patient does not appear acutely confused or altered, and denied headache.  Patient was made a code sepsis and broad-spectrum antibiotics have been initiated.  The patient himself stated that he wanted to go home.  He got dressed, and was initially somewhat combative with staff until I was able to verbally redirect him.  We contacted the Sheriff's office and DSS to confirm the patient is in fact a ward of the state, and has been deemed incompetent to make his own medical decisions.  I informed the patient that we were concerned for infection, and his workup also showed acute kidney injury and possible strain on his heart.  I informed him that we would need to keep him in the hospital, and he expressed agreement.  Plan for admission.  ----------------------------------------- 5:14 PM on 07/13/2017 -----------------------------------------  I signed the patient out to the hospitalist Dr. Amado Coe for  admission. ____________________________________________   FINAL CLINICAL IMPRESSION(S) / ED DIAGNOSES  Final diagnoses:  Sepsis, due to unspecified organism (HCC)  Acute renal insufficiency  Dehydration      NEW MEDICATIONS STARTED DURING THIS VISIT:  New Prescriptions   No medications on file     Note:  This document was prepared using Dragon voice recognition software and may include unintentional dictation errors.    Dionne Bucy, MD 07/08/2017 1714

## 2017-07-14 NOTE — Progress Notes (Signed)
CODE SEPSIS - PHARMACY COMMUNICATION  **Broad Spectrum Antibiotics should be administered within 1 hour of Sepsis diagnosis**  Time Code Sepsis Called/Page Received: 1646  Antibiotics Ordered: vancomycin + piperacillin/tazobactam  Time of 1st antibiotic administration: 1644  Cindi CarbonMary M Charis Juliana ,PharmD Clinical Pharmacist  06/28/2017  5:21 PM

## 2017-07-14 NOTE — ED Notes (Signed)
Tiffany RN is attempted to stick pt for blood cultures at this time.

## 2017-07-14 NOTE — Progress Notes (Signed)
Requested RN call back before placing patient.

## 2017-07-14 NOTE — ED Notes (Signed)
Brent Lewis, NT remains with pt at this time. Pt is in bed and resting with eyes closed.

## 2017-07-14 NOTE — ED Notes (Signed)
Nurse called lab for lactic acid draw.

## 2017-07-14 NOTE — ED Notes (Addendum)
Dr. Marisa SeverinSiadecki was made aware that pt was attempting to leave. Nurse spoke with pt and explained that he didn't have a way home since EMS brought him in and he was unable to drive. Pt was escorted back to the room by this nurse and Clydie BraunKaren, Charity fundraiserN. Tammy, NT is sitting with pt at this time while waiting on Dr. Marisa SeverinSiadecki

## 2017-07-14 NOTE — ED Notes (Signed)
Unsuccessful attempts x2 on cultures by Elmarie Shileyiffany, RN

## 2017-07-14 NOTE — ED Notes (Signed)
Nurse and NAII students helped pt with cleaning. Pt had some old stool. Pt placed on clean linens and given 2 new warm blankets along with two others to warm him up. Pt is more responsive now than on arrival. Pt has skin break down noted to his tailbone and his left testicle. Pt appears thin and weak at this time. Pt is denying any pain. Pt is lethargic.

## 2017-07-14 NOTE — ED Notes (Signed)
Lactic acid 4 provider to be notified.

## 2017-07-15 ENCOUNTER — Inpatient Hospital Stay: Payer: Medicare Other

## 2017-07-15 LAB — BASIC METABOLIC PANEL
ANION GAP: 10 (ref 5–15)
BUN: 44 mg/dL — AB (ref 6–20)
CHLORIDE: 104 mmol/L (ref 101–111)
CO2: 24 mmol/L (ref 22–32)
Calcium: 9 mg/dL (ref 8.9–10.3)
Creatinine, Ser: 1.26 mg/dL — ABNORMAL HIGH (ref 0.61–1.24)
GFR calc Af Amer: >60 mL/min (ref >60)
GFR, EST NON AFRICAN AMERICAN: 53 mL/min — AB (ref >60)
Glucose, Bld: 105 mg/dL — ABNORMAL HIGH (ref 65–99)
POTASSIUM: 3.9 mmol/L (ref 3.5–5.1)
Sodium: 138 mmol/L (ref 135–145)

## 2017-07-15 LAB — CBC WITH DIFFERENTIAL/PLATELET
BASOS ABS: 0.1 10*3/uL (ref 0–0.1)
Basophils Relative: 0 %
EOS PCT: 0 %
Eosinophils Absolute: 0 10*3/uL (ref 0–0.7)
HEMATOCRIT: 37.2 % — AB (ref 40.0–52.0)
HEMOGLOBIN: 12.1 g/dL — AB (ref 13.0–18.0)
LYMPHS PCT: 2 %
Lymphs Abs: 0.3 10*3/uL — ABNORMAL LOW (ref 1.0–3.6)
MCH: 30.7 pg (ref 26.0–34.0)
MCHC: 32.6 g/dL (ref 32.0–36.0)
MCV: 94.2 fL (ref 80.0–100.0)
Monocytes Absolute: 1.7 10*3/uL — ABNORMAL HIGH (ref 0.2–1.0)
Monocytes Relative: 9 %
NEUTROS ABS: 17.8 10*3/uL — AB (ref 1.4–6.5)
NEUTROS PCT: 89 %
PLATELETS: 63 10*3/uL — AB (ref 150–440)
RBC: 3.95 MIL/uL — AB (ref 4.40–5.90)
RDW: 18.4 % — ABNORMAL HIGH (ref 11.5–14.5)
WBC: 19.9 10*3/uL — AB (ref 3.8–10.6)

## 2017-07-15 LAB — LACTIC ACID, PLASMA
LACTIC ACID, VENOUS: 1.7 mmol/L (ref 0.5–1.9)
LACTIC ACID, VENOUS: 2.1 mmol/L — AB (ref 0.5–1.9)
LACTIC ACID, VENOUS: 3.3 mmol/L — AB (ref 0.5–1.9)

## 2017-07-15 LAB — PROCALCITONIN: Procalcitonin: 11.38 ng/mL

## 2017-07-15 MED ORDER — SODIUM CHLORIDE 0.9 % IV SOLN
INTRAVENOUS | Status: DC
  Administered 2017-07-15: 01:00:00 via INTRAVENOUS

## 2017-07-15 MED ORDER — DIPHENHYDRAMINE HCL 50 MG/ML IJ SOLN: 25.0000 mg | Freq: Once | INTRAMUSCULAR | Status: DC | PRN

## 2017-07-15 MED ORDER — HALOPERIDOL LACTATE 5 MG/ML IJ SOLN
2.0000 mg | Freq: Once | INTRAMUSCULAR | Status: AC
  Administered 2017-07-15: 2 mg via INTRAVENOUS
  Filled 2017-07-15 (×2): qty 0.4

## 2017-07-15 MED ORDER — SODIUM CHLORIDE 0.9 % IV SOLN
INTRAVENOUS | Status: DC
  Administered 2017-07-15: 16:00:00 via INTRAVENOUS

## 2017-07-15 MED ORDER — SODIUM CHLORIDE 0.9 % IV SOLN
INTRAVENOUS | Status: AC
  Administered 2017-07-15 (×2): via INTRAVENOUS

## 2017-07-15 NOTE — Progress Notes (Signed)
Care Nurse, Lavona MoundKrustin, RN notified of critical lactic acid of 3.3; acknowledged; Windy Carinaurner,Brelee Renk K, RN 12:03 AM 07/15/2017

## 2017-07-15 NOTE — Clinical Social Work Note (Addendum)
Clinical Social Work Assessment  Patient Details  Name: Brent Lewis MRN: [id number] Date of Birth: 12/13/1940  Date of referral:  07/15/17               Reason for consult:  Discharge Planning                Permission sought to share information with:    Permission granted to share information::     Name::        Agency::     Relationship::     Contact Information:     Housing/Transportation Living arrangements for the past 2 months:  Group Home Source of Information:  Facility Patient Interpreter Needed:  None Criminal Activity/Legal Involvement Pertinent to Current Situation/Hospitalization:  No - Comment as needed Significant Relationships:  Other(Comment)(grandchild: Charlies Silverserrel [place]:: [phone]) Lives with:    Do you feel safe going back to the place where you live?    Need for family participation in patient care:     Care giving concerns:  Patient resides at [facility] group home. The owner is Bonita QuinLinda: [phone].   Social Worker assessment / plan:  CSW spoke with Bonita QuinLinda at [facility] and she stated that she will be able to take patient back at discharge. She has no concerns at this time. DSS [place] has guardianship: Paperwork is on the front of the chart. Renea Eebony White is the caseworker listed as guardian: [phone].   Employment status:  Disabled (Comment on whether or not currently receiving Disability) Insurance information:  Managed Medicare PT Recommendations:    Information / Referral to community resources:     Patient/Family's Response to care:  Unable to reach family and patient is confused.  Patient/Family's Understanding of and Emotional Response to Diagnosis, Current Treatment, and Prognosis:  Unable to reach family and patient is confused.  Emotional Assessment Appearance:  Appears stated age Attitude/Demeanor/Rapport:  Unable to Assess Affect (typically observed):  Unable to Assess Orientation:  Oriented to Self Alcohol / Substance use:  Not  Applicable Psych involvement (Current and /or in the community):  No (Comment)  Discharge Needs  Concerns to be addressed:  Care Coordination Readmission within the last 30 days:  No Current discharge risk:  None Barriers to Discharge:  No Barriers Identified   York SpanielMonica Neleh Muldoon, LCSW 07/15/2017, 2:27 PM

## 2017-07-15 NOTE — Evaluation (Addendum)
Clinical/Bedside Swallow Evaluation Patient Details  Name: Brent Lewis MRN: [id number] Date of Birth: 1941/01/19  Today's Date: 07/15/2017 Time: SLP Start Time (ACUTE ONLY): 1345 SLP Stop Time (ACUTE ONLY): 1415 SLP Time Calculation (min) (ACUTE ONLY): 30 min  Past Medical History: No past medical history on file. Past Surgical History: No past surgical history on file. HPI:  Brent Lewis  is a 77 y.o. male with a known history of chronic tracheostomy is brought into the emergency department as before he was altered.  Patient is ward of state and initially he was hypoxemic but chest x-ray has revealed COPD changes but no acute infiltrates.   Assessment / Plan / Recommendation Clinical Impression  SLP Visit Diagnosis: Dysphagia, oral phase (R13.11)  Pt presents w/oral phase dysphagia. 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 As pt breathes it appears airflow is coming only through stoma. Consulted with MD as well, whom agreed it may not be a tracheostomy but a stoma from a possible laryngectomy. Pt is unable to give further history. Pt given trials of ice chips, thin water, puree and solid. Pt was able to consume all consistencies well, except solid in which he demonstrated difficulty masticating because he is edentulous. He was able to clear with f/u sips of thin liquids. No overt s/s of aspiration were observed with any consistencies. If pt has stoma from a laryngectomy he would not be at risk of aspiration. However, he would benefit from puree diet d/t difficulty masticating solid consistency. Pt also demonstrated difficulty feeding himself and will need assistance. Will f/u for toleration of diet (oral phase only) x1. MD and nsg in agreement. Pt would benefit from ENT consult post discharge for appropriate stoma care.     Aspiration Risk  No limitations    Diet Recommendation Dysphagia 1  (Puree);Thin liquid   Liquid Administration via: Cup;Straw Medication Administration: Whole meds with liquid Supervision: Staff to assist with self feeding Postural Changes: Seated upright at 90 degrees    Other  Recommendations Recommended Consults: Consider ENT evaluation(May need ENT eval for stoma care) Oral Care Recommendations: Oral care BID;Staff/trained caregiver to provide oral care   Follow up Recommendations Skilled Nursing facility      Frequency and Duration Other (Comment)(1 time visit)  1 week       Prognosis Prognosis for Safe Diet Advancement: Good      Swallow Study   General Date of Onset: 07/15/17 HPI: Brent Lewis  is a 77 y.o. male with a known history of chronic tracheostomy is brought into the emergency department as before he was altered.  Patient is ward of state and initially he was hypoxemic but chest x-ray has revealed COPD changes but no acute infiltrates. Type of Study: Bedside Swallow Evaluation Previous Swallow Assessment: none known  Diet Prior to this Study: NPO Temperature Spikes Noted: No Respiratory Status: Room air History of Recent Intubation: No Behavior/Cognition: Alert;Cooperative;Other (Comment)(Unable to voice) Oral Cavity Assessment: Dry Oral Care Completed by SLP: No Oral Cavity - Dentition: Edentulous Vision: (unable to assess) Self-Feeding Abilities: Needs assist;Total assist Patient Positioning: Upright in bed Baseline Vocal Quality: Aphonic Volitional Cough: (Upon further inspection of open stoma. Realized pt has had a laryngectomy. All air diverted through stoma. ) Volitional Swallow: Able to elicit    Oral/Motor/Sensory Function Overall Oral Motor/Sensory Function: Other (comment)(Pt demonstrated some difficulty following commands, but grossly WFL)   Ice Chips Ice chips: Within  functional limits   Thin Liquid Thin Liquid: Within functional limits    Nectar Thick Nectar Thick Liquid: Not tested   Honey Thick Honey Thick  Liquid: Not tested   Puree Puree: Within functional limits   Solid   GO    Solid: Impaired Oral Phase Impairments: Impaired mastication Oral Phase Functional Implications: Impaired mastication        Hutchins,Maaran 07/15/2017,3:28 PM  Addendum: Upon review of evaluation. Updated clinical impression.

## 2017-07-15 NOTE — Progress Notes (Signed)
Notified Dr Anne HahnWillis of patient confused and jumping out of bed. Pulse ox dropped to 84 and went up to 95. Dr has ordered Haldol one time dose and continuous pulse ox.

## 2017-07-15 NOTE — Progress Notes (Signed)
Notified Dr. Anne HahnWillis of lactic acid 3.3. He has ordered continuous NS and redraw of lactic acid. Will administer NS as ordered and continue to monitor patient.

## 2017-07-15 NOTE — Progress Notes (Signed)
Pharmacy Antibiotic Note  Brent Lewis is a 77 y.o. male admitted on 07/21/2017 with sepsis.  Pharmacy has been consulted for vancomycin + piperacillin/tazobactam dosing.  3/21: Wt = 77 kg per RN. CrCl 38 mL/min  Plan: Piperacillin/tazobactam 3.375 g IV q8h EI  Vancomycin 1000 mg IV given in ED. Will order vancomycin 1000 mg IV q24h (6 hr stack) Goal VT 15-20 mcg/mL  Kinetics: Ke: 0.035 Half-life: 20 hrs Cmin ~15 mcg/mL   Height: 6\' 3"  (190.5 cm) Weight: 116 lb 2.9 oz (52.7 kg) IBW/kg (Calculated) : 84.5  Temp (24hrs), Avg:97.1 F (36.2 C), Min:96.4 F (35.8 C), Max:97.5 F (36.4 C)  Recent Labs  Lab 07/01/2017 1310 06/29/2017 1535 07/06/2017 1852 06/26/2017 2235 07/04/2017 2248 07/15/17 0020 07/15/17 0413  WBC 22.4*  --   --  19.7*  --  19.9*  --   CREATININE 1.76*  --   --  1.34*  --  1.26*  --   LATICACIDVEN  --  4.0* 3.0*  --  3.3* 2.1* 1.7    Estimated Creatinine Clearance: 36.6 mL/min (A) (by C-G formula based on SCr of 1.26 mg/dL (H)).    No Known Allergies   Antimicrobials this admission: vancomycin 3/21 >>  Piperacillin/tazobactam 3/21 >>   Dose adjustments this admission:  Microbiology results: 3/21 BCx: Sent  Thank you for allowing pharmacy to be a part of this patient's care.  Angelique BlonderMerrill,Moesha Sarchet A, PharmD, BCPS Clinical Pharmacist 07/15/2017 1:27 PM

## 2017-07-15 NOTE — Progress Notes (Signed)
[facility] [place] [facility]   PATIENT NAME: Brent SinningLacy Lewis    MR#:  [id number]  DATE OF BIRTH:  03/28/1941  SUBJECTIVE:admitted for sepsis and found out  He has penumonia. 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  CHIEF COMPLAINT:   Chief Complaint  Patient presents with  . Dehydration    REVIEW OF SYSTEMS:    Unable to obtain review of systems because of hismedical condition .  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 Nutrition: Tolerating Diet: Tolerating PT:      DRUG ALLERGIES:  No Known Allergies  VITALS:  Blood pressure 137/81, pulse 88, temperature 97.7 F (36.5 C), temperature source Oral, resp. rate 18, height 6\' 3"  (1.905 m), weight 52.7 kg (116 lb 2.9 oz), SpO2 96 %.  PHYSICAL EXAMINATION:   Physical Exam  GENERAL:  77 y.o.-year-old patient lying in the bed with no acute distress.  EYES: Pupils equal, round, reactive to light and accommodation. No scleral icterus. Extraocular muscles intact.  HEENT: Head atraumatic, normocephalic  Distention, patient had a opening in the midline, possible laryngectomy. NECK:  Supple, no jugular venous.  Distention. No thyroid enlargement, no tenderness opening in the midline of the neck.  Sepsis present on admission secondary to.  LUNGS: Normal breath sounds bilaterally, no wheezing, rales,rhonchi or crepitation. No use of accessory muscles of respiration.  CARDIOVASCULAR: S1, S2 normal. No murmurs, rubs, or gallops.  ABDOMEN: Soft, nontender, nondistended. Bowel sounds present. No organomegaly or mass.  EXTREMITIES: No pedal edema, cyanosis, or clubbing.  NEUROLOGIC: Cranial nerves II through XII are intact. Muscle strength 5/5 in all extremities. Sensation intact. Gait not checked.  PSYCHIATRIC: The patient is alert and oriented x 3.  SKIN: No obvious rash, lesion, or ulcer.    LABORATORY PANEL:   CBC Recent Labs  Lab 07/15/17 0020  WBC 19.9*  HGB 12.1*  HCT 37.2*  PLT 63*    ------------------------------------------------------------------------------------------------------------------  Chemistries  Recent Labs  Lab 01-Jan-2018 2235 07/15/17 0020  NA 136 138  K 3.7 3.9  CL 102 104  CO2 22 24  GLUCOSE 106* 105*  BUN 45* 44*  CREATININE 1.34* 1.26*  CALCIUM 8.7* 9.0  AST 118*  --   ALT 48  --   ALKPHOS 224*  --   BILITOT 9.5*  --    ------------------------------------------------------------------------------------------------------------------  Cardiac Enzymes Recent Labs  Lab 01-Jan-2018 1310  TROPONINI 0.04*   ------------------------------------------------------------------------------------------------------------------  RADIOLOGY:  Dg Chest 2 View  Result Date: 03-29-2018 CLINICAL DATA:  Possible hypoxia EXAM: CHEST - 2 VIEW COMPARISON:  01/12/2016 FINDINGS: Tracheostomy tube is been removed in the interval. Severe COPD with hyperinflation. Negative for heart failure or pneumonia. Mild bibasilar scarring. Negative for pleural effusion IMPRESSION: Severe COPD. Bibasilar scarring without acute cardiopulmonary abnormality. Electronically Signed   By: Marlan Palauharles  Clark M.D.   On: 012-07-2017 13:54   Dg Chest Port 1 View  Result Date: 07/15/2017 CLINICAL DATA:  Sepsis EXAM: PORTABLE CHEST 1 VIEW COMPARISON:  012-07-2017 FINDINGS: The lungs are hyperinflated likely secondary to COPD. There is hazy left lower lobe airspace disease which may reflect atelectasis versus pneumonia. There is no pleural effusion or pneumothorax. The heart and mediastinal contours are unremarkable. The osseous structures are unremarkable. IMPRESSION: 1. Hazy left lower lobe airspace disease which may reflect atelectasis versus pneumonia. 2. COPD. Electronically Signed   By: Elige KoHetal  Patel   On: 07/15/2017 14:40     ASSESSMENT AND PLAN:   Active Problems:  Sepsis (HCC)   #1.Sepsis present on admission due to Pneumonia; Continue vancomycin,Zosyn,Lactic acid level coming  down 2. Acute renal failure;improving 3.laryngeal CA;s/p laryngectomy;seen by speech;started on dysphagia diet 4.PT is ward of state.heis from group home Metabolic encephalopathy;improved,Alert today.    All the records are reviewed and case discussed with Care Management/Social Workerr. Management plans discussed with the patient, family and they are in agreement.  CODE STATUS:full  TOTAL TIME TAKING CARE OF THIS PATIENT:35 minutes.   POSSIBLE D/C IN 1-2DAYS, DEPENDING ON CLINICAL CONDITION.   Katha Hamming M.D on 07/15/2017 at 7:16 PM  Between 7am to 6pm - Pager - [phone]  After 6pm go to [email or web address] - password [email or web address]  [facility]  Office  [phone]  CC: Primary care physician; Patient, No Pcp Per

## 2017-07-16 LAB — BASIC METABOLIC PANEL
Anion gap: 19 — ABNORMAL HIGH (ref 5–15)
BUN: 55 mg/dL — AB (ref 6–20)
CO2: 11 mmol/L — ABNORMAL LOW (ref 22–32)
CREATININE: 2.18 mg/dL — AB (ref 0.61–1.24)
Calcium: 9.3 mg/dL (ref 8.9–10.3)
Chloride: 113 mmol/L — ABNORMAL HIGH (ref 101–111)
GFR calc Af Amer: 32 mL/min — ABNORMAL LOW (ref >60)
GFR, EST NON AFRICAN AMERICAN: 27 mL/min — AB (ref >60)
Glucose, Bld: 205 mg/dL — ABNORMAL HIGH (ref 65–99)
Potassium: 4.4 mmol/L (ref 3.5–5.1)
SODIUM: 143 mmol/L (ref 135–145)

## 2017-07-16 LAB — CBC
HCT: 37.9 % — ABNORMAL LOW (ref 40.0–52.0)
Hemoglobin: 11.9 g/dL — ABNORMAL LOW (ref 13.0–18.0)
MCH: 30.6 pg (ref 26.0–34.0)
MCHC: 31.5 g/dL — AB (ref 32.0–36.0)
MCV: 97.2 fL (ref 80.0–100.0)
PLATELETS: 58 10*3/uL — AB (ref 150–440)
RBC: 3.9 MIL/uL — ABNORMAL LOW (ref 4.40–5.90)
RDW: 19.4 % — AB (ref 11.5–14.5)
WBC: 13.8 10*3/uL — ABNORMAL HIGH (ref 3.8–10.6)

## 2017-07-16 LAB — GLUCOSE, CAPILLARY
GLUCOSE-CAPILLARY: 161 mg/dL — AB (ref 65–99)
Glucose-Capillary: 66 mg/dL (ref 65–99)
Glucose-Capillary: 72 mg/dL (ref 65–99)
Glucose-Capillary: 126 mg/dL — ABNORMAL HIGH (ref 65–99)

## 2017-07-16 LAB — PROCALCITONIN: Procalcitonin: 6.4 ng/mL

## 2017-07-16 LAB — MRSA PCR SCREENING: MRSA BY PCR: NEGATIVE

## 2017-07-16 MED ORDER — AMLODIPINE BESYLATE 5 MG PO TABS
2.5000 mg | ORAL_TABLET | Freq: Every day | ORAL | Status: DC
  Administered 2017-07-16: 2.5 mg via ORAL
  Filled 2017-07-16: qty 1

## 2017-07-16 MED ORDER — SODIUM CHLORIDE 0.9 % IV BOLUS (SEPSIS)
1000.0000 mL | Freq: Once | INTRAVENOUS | Status: AC
  Administered 2017-07-16: 1000 mL via INTRAVENOUS

## 2017-07-16 MED ORDER — MIRTAZAPINE 15 MG PO TABS
15.0000 mg | ORAL_TABLET | Freq: Every day | ORAL | Status: DC
  Administered 2017-07-16: 15 mg via ORAL
  Filled 2017-07-16: qty 1

## 2017-07-16 MED ORDER — DEXTROSE 50 % IV SOLN
25.0000 mL | Freq: Once | INTRAVENOUS | Status: AC
  Administered 2017-07-16: 50 mL via INTRAVENOUS

## 2017-07-16 MED ORDER — DEXTROSE 50 % IV SOLN
INTRAVENOUS | Status: AC
  Administered 2017-07-16: 50 mL via INTRAVENOUS
  Filled 2017-07-16: qty 50

## 2017-07-16 MED ORDER — GLUCAGON HCL RDNA (DIAGNOSTIC) 1 MG IJ SOLR
INTRAMUSCULAR | Status: AC
  Filled 2017-07-16: qty 1

## 2017-07-16 MED ORDER — LEVOTHYROXINE SODIUM 100 MCG PO TABS: 100.0000 ug | ORAL_TABLET | Freq: Every day | ORAL | Status: DC

## 2017-07-16 MED ORDER — HEPARIN SODIUM (PORCINE) 5000 UNIT/ML IJ SOLN: 5000.0000 [IU] | Freq: Three times a day (TID) | INTRAMUSCULAR | Status: DC

## 2017-07-16 MED ORDER — DEXTROSE-NACL 5-0.45 % IV SOLN
INTRAVENOUS | Status: AC
  Administered 2017-07-16: 13:00:00 via INTRAVENOUS

## 2017-07-16 MED ORDER — DEXTROSE 10 % IV SOLN
INTRAVENOUS | Status: DC
  Administered 2017-07-16: 06:00:00 via INTRAVENOUS

## 2017-07-16 NOTE — Progress Notes (Signed)
Notified Dr. Anne HahnWillis of patient been agitated and trying to get out of bed. Haldol 2mg  once ordered. Will continue to monitor.

## 2017-07-16 NOTE — Progress Notes (Signed)
  Speech Language Pathology Treatment: Dysphagia  Patient Details Name: Brent Lewis MRN: [id number] DOB: 06-22-1940 Today's Date: 07/16/2017 Time: [id number] SLP Time Calculation (min) (ACUTE ONLY): 35 min  Assessment / Plan / Recommendation Clinical Impression  Pt had a rapid response last night d/t BS 21 and blood pressure 70/52 but remains in room on floor. Nsg reports pt has not been eating much and that they were worried about feeding him.  Pt lying in bed with aide in room. Nsg aide reports she has been afraid to feed the pt because he has been lethargic. Positioned pt upright and pt became alert. Provided with trials of ice chips, thin water by cup and straw, and puree consistency. No overt s/s of aspiration were observed with any consistency. Pt noted to breathe through stoma during swallow, no secretions or residue observed at stoma. Pt remains aphonic. Oral phase appeared [place] for given consistencies. No pocketing or holding observed. Recommend continue w/Dysphagia I diet with thin liquids.*Discussed with nsg and MD possibility that pt has a laryngectomy stoma not a tracheostomy. MD to order ENT consult to confirm laryngectomy stoma and for stoma care. Will continue to follow pt.   HPI HPI: Brent Lewis  is a 77 y.o. male with a known history of chronic tracheostomy is brought into the emergency department as before he was altered.  Patient is ward of state and initially he was hypoxemic but chest x-ray has revealed COPD changes but no acute infiltrates.      SLP Plan  Goals updated       Recommendations  Diet recommendations: Dysphagia 1 (puree);Thin liquid Liquids provided via: Straw;Cup Medication Administration: Whole meds with liquid Supervision: Staff to assist with self feeding Compensations: Slow rate Postural Changes and/or Swallow Maneuvers: Seated upright 90 degrees                Oral Care Recommendations: Oral care BID;Staff/trained caregiver to provide oral  care Follow up Recommendations: Skilled Nursing facility SLP Visit Diagnosis: Dysphagia, oral phase (R13.11) Plan: Goals updated       GO                Quinton,Cleavon Goldman 07/16/2017, 12:42 PM

## 2017-07-16 NOTE — Consult Note (Signed)
Brent Lewis, Brent Lewis [id number] 07/25/1940 Adrian Saran, MD  Reason for Consult: To evaluate the trach   HPI: The patient is a 77 year old male who presented to emergency room 2 days ago for some lab work.  He is a ward of the state.  He was having shortness of breath so ended up in the emergency room.  He was noted to have early signs of sepsis and was admitted to the hospital on IV medications because of some pneumonia.  He had some crusting around his trach site and there is concerns that this was causing obstruction.  There is been questions about whether this is a permanent stoma versus a tracheostomy hole.  Assessment is made of his tracheal airway.  Allergies: No Known Allergies  ROS: Review of systems normal other than 12 systems except per HPI.  PMH: No past medical history on file.  FH: No family history on file.  SH:  Social History   Socioeconomic History  . Marital status: Married    Spouse name: Not on file  . Number of children: Not on file  . Years of education: Not on file  . Highest education level: Not on file  Occupational History  . Not on file  Social Needs  . Financial resource strain: Not on file  . Food insecurity:    Worry: Not on file    Inability: Not on file  . Transportation needs:    Medical: Not on file    Non-medical: Not on file  Tobacco Use  . Smoking status: Former Games developer  . Smokeless tobacco: Never Used  Substance and Sexual Activity  . Alcohol use: Not on file  . Drug use: Not on file  . Sexual activity: Not on file  Lifestyle  . Physical activity:    Days per week: Not on file    Minutes per session: Not on file  . Stress: Not on file  Relationships  . Social connections:    Talks on phone: Not on file    Gets together: Not on file    Attends religious service: Not on file    Active member of club or organization: Not on file    Attends meetings of clubs or organizations: Not on file    Relationship status: Not on file  . Intimate  partner violence:    Fear of current or ex partner: Not on file    Emotionally abused: Not on file    Physically abused: Not on file    Forced sexual activity: Not on file  Other Topics Concern  . Not on file  Social History Narrative  . Not on file    PSH: No past surgical history on file.  Physical  Exam:  the patient is awake but not responding to anything particular that I am  saying.  He has a normal nose and oropharynx.  He is edentulous.  No sign of any oral lesions.  His neck is very thin.  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  There is dried crusting all the way around the stomal opening.  This is removed.  He has dry crust inside the upper trachea and these are all removed as well.  The rest of his trachea appears to be healthy.  I can see down to almost the carina.  He does not have any mucus there but his trachea is a little bit dry.  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  He does not need a trach tube as this is open and healed very well.  It is very dry around the opening and I have discussed with the nurse to place just a little Vaseline on the skin to lubricated lightly, and make sure is placed very thinly on the surface.  He could use humidification which might be a room humidifier.  He does not need a trach collar at this point.  His caretakers will need to make sure there is good skin care of the trach stoma to keep it from filling up with cross around it that could lead to infection in the future.  He does not need any specific follow-up in the office.   Cammy Copaaul H Arissa Fagin 07/16/2017 5:26 PM

## 2017-07-16 NOTE — Progress Notes (Signed)
Notified Dr. Sheryle Hailiamond of pt BP of 76/38 and lethargic with BS 21 . 1 liter NS bolus was ordered. Rapid Response was called.  Interventions: IV started . Pt received 1 and 1/2 amp dextrose and 1 liter bolus as ordered. Pt blood sugar rechecked 66 to 72 . Pt then then started on D 10 . Pt.  alert and arousable sats 100% RA. Pt was hypo thermic(91.6) bair hugger applied. MD aware of pt. Change in condition. Will continue to monitor.

## 2017-07-16 NOTE — Progress Notes (Addendum)
Patient BS rechecked at 126. Will continue to monitor.

## 2017-07-16 NOTE — Clinical Social Work Note (Signed)
CSW attempted to contact the patient's legal guardian and left a HIPPA compliant voicemail alerting her to an update on the patient's care as PT has recommended STR and palliative care has been requested by the attending MD to assess the patient for care needs. The CSW is following.  Argentina PonderKaren Martha Balin Vandegrift, MSW, Theresia MajorsLCSWA [phone]

## 2017-07-16 NOTE — Progress Notes (Signed)
Rapid Response Event Note  Overview: RR called arrived to room pt lethargic /hypotensive. RT,and AC and nurses at bedside      Initial Focused Assessment:pt BS 21 and blood pressure 70/52    Interventions: IV started . Pt received dextrose and 1 liter bolus as ordered. Pt blood sugar rechecked 66 . Pt then received another 1/2 amp of dextrose BS 72 then started on D 10 . Pt.  alert and arousable sats  WNL. Pt was hypo thermic bair hugger applied. MD aware of pt. Change in condition  Plan of Care (if not transferred):cont to monitor pt . BP and recheck BS in one hr  Event Summary:   at  0444    at          [facility] L PWilliams,Michelle J

## 2017-07-16 NOTE — NC FL2 (Signed)
  [place] MEDICAID FL2 LEVEL OF CARE SCREENING TOOL     IDENTIFICATION  Patient Name: Brent Lewis Birthdate: Sep 10, 1940 Sex: male Admission Date (Current Location): 07/21/2017  [place]   Facility and Address:  [facility], [address], [place], [place] [address]      Provider Number: [id number]  Attending Physician Name and Address:  Adrian SaranMody, Sital, MD  Relative Name and Phone Number:       Current Level of Care: Hospital Recommended Level of Care: SNF Prior Approval Number:    Date Approved/Denied:   PASRR Number:  Pending  Discharge Plan: SNF    Current Diagnoses: Patient Active Problem List   Diagnosis Date Noted  . Sepsis (HCC) 06/26/2017  . Alcohol abuse 01/12/2016    Orientation RESPIRATION BLADDER Height & Weight     Self  Normal   Weight: 116 lb 2.9 oz (52.7 kg) Height:  6\' 3"  (190.5 cm)  BEHAVIORAL SYMPTOMS/MOOD NEUROLOGICAL BOWEL NUTRITION STATUS  (none)      Dysphagia 1, thin liquids  AMBULATORY STATUS COMMUNICATION OF NEEDS Skin   Extensive Assistance Non-Verbally(writes in order to communicate) Normal                       Personal Care Assistance Level of Assistance  Dressing, Bathing Bathing Assistance: Limited assistance   Dressing Assistance: Limited assistance     Functional Limitations Info             SPECIAL CARE FACTORS FREQUENCY                       Contractures Contractures Info: Not present    Additional Factors Info                  Current Medications (07/16/2017):  This is the current hospital active medication list Current Facility-Administered Medications  Medication Dose Route Frequency Provider Last Rate Last Dose  . acetaminophen (TYLENOL) suppository 650 mg  650 mg Rectal Q6H PRN Gouru, Aruna, MD      . amLODipine (NORVASC) tablet 2.5 mg  2.5 mg Oral Daily Mody, Sital, MD   2.5 mg at 07/16/17 1247  . dextrose 5 %-0.45 % sodium chloride  infusion   Intravenous Continuous Adrian SaranMody, Sital, MD 75 mL/hr at 07/16/17 1247    . diphenhydrAMINE (BENADRYL) injection 25 mg  25 mg Intravenous Once PRN Oralia ManisWillis, David, MD      . Melene Muller[START ON 07/11/17] levothyroxine (SYNTHROID, LEVOTHROID) tablet 100 mcg  100 mcg Oral QAC breakfast Mody, Sital, MD      . mirtazapine (REMERON) tablet 15 mg  15 mg Oral QHS Mody, Sital, MD      . ondansetron (ZOFRAN) injection 4 mg  4 mg Intravenous Q6H PRN Gouru, Aruna, MD      . pantoprazole (PROTONIX) injection 40 mg  40 mg Intravenous Q24H Gouru, Aruna, MD   40 mg at 07/15/17 1828  . piperacillin-tazobactam (ZOSYN) IVPB 3.375 g  3.375 g Intravenous Q8H Cindi CarbonSwayne, Mary M, RPH 12.5 mL/hr at 07/16/17 1259 3.375 g at 07/16/17 1259     Discharge Medications: Please see discharge summary for a list of discharge medications.  Relevant Imaging Results:  Relevant Lab Results:   Additional Information  SS# [id number]  Judi CongKaren M Latisha Lasch, LCSW

## 2017-07-16 NOTE — Evaluation (Addendum)
Physical Therapy Evaluation Patient Details Name: Brent Lewis MRN: [id number] DOB: 06-19-1940 Today's Date: 07/16/2017   History of Present Illness  pt is a 77 y.o M admitted on 07/24/2017 with dx of sepsis. He has a PMHx of chronic tracheostomy. upon admission he was altered and exhibit hypoxemia. pt also coded the previous night and rapid response was called.   Clinical Impression  Upon entering the room pt is laying in bed with a sitter present. Pt has a hx of tracheostomy and demonstrates hx of altered mental status and is lethargic is unable to answer questions regard alertness/ orientation. Pt shook his head no when asked about pain. Attempted to assess strength in both supine and sitting and pt demonstrated limited effort. He requires verbal/ tactile cues +1 max assist for bed mobility going from supine <>sit. Once on EOB pt was able to stay seated for ~2 min before fatiguing and returning back to supine. Pt would benefit from continued physcial therapy to promote bed mobility, LE mobility/ strength and functional mobility. Upon discharge he would benefit from continued physical therapy via SNF.     Follow Up Recommendations SNF    Equipment Recommendations       Recommendations for Other Services       Precautions / Restrictions Precautions Precautions: Fall Restrictions Weight Bearing Restrictions: No      Mobility  Bed Mobility Overal bed mobility: Needs Assistance Bed Mobility: Supine to Sit     Supine to sit: Max assist(+1)     General bed mobility comments: pt required verbal cues for moving legs and elevated HOB to assist with transfer, once sitting on EOB he fatigued quickly and was only able to sit for ~2 min before laying back down  Transfers Overall transfer level: (unable to perform)                  Ambulation/Gait                Stairs            Wheelchair Mobility    Modified Rankin (Stroke Patients Only)       Balance                                              Pertinent Vitals/Pain Pain Assessment: No/denies pain    Home Living Family/patient expects to be discharged to:: Unsure                 Additional Comments: pt lives in a group home unsure of location    Prior Function Level of Independence: Needs assistance   Gait / Transfers Assistance Needed: Unable to perform gait or transfers           Hand Dominance   Dominant Hand: Right    Extremity/Trunk Assessment   Upper Extremity Assessment Upper Extremity Assessment: Defer to OT evaluation    Lower Extremity Assessment Lower Extremity Assessment: Difficult to assess due to impaired cognition       Communication   Communication: Tracheostomy  Cognition Arousal/Alertness: Lethargic Behavior During Therapy: Restless Overall Cognitive Status: Within Functional Limits for tasks assessed                                        General Comments  Exercises Other Exercises Other Exercises: AAROM bil hip flexion/ extension 1 x 10, knee flexion/ extension 1 x 10, ankle DF/PF 1 x 10   Assessment/Plan    PT Assessment Patient needs continued PT services  PT Problem List Decreased strength;Decreased range of motion;Decreased activity tolerance;Decreased mobility;Decreased cognition       PT Treatment Interventions Therapeutic activities;Therapeutic exercise;Functional mobility training;DME instruction    PT Goals (Current goals can be found in the Care Plan section)  Acute Rehab PT Goals Patient Stated Goal: unable to state PT Goal Formulation: With patient Time For Goal Achievement: 07/30/17 Potential to Achieve Goals: Fair Additional Goals Additional Goal #1: to be able to sit EOB for >/= 5 min for functional seated balance and core control.    Frequency Min 2X/week   Barriers to discharge        Co-evaluation               AM-PAC PT "6 Clicks" Daily Activity   Outcome Measure Difficulty turning over in bed (including adjusting bedclothes, sheets and blankets)?: Unable Difficulty moving from lying on back to sitting on the side of the bed? : Unable Difficulty sitting down on and standing up from a chair with arms (e.g., wheelchair, bedside commode, etc,.)?: Unable Help needed moving to and from a bed to chair (including a wheelchair)?: Total Help needed walking in hospital room?: Total Help needed climbing 3-5 steps with a railing? : Total 6 Click Score: 6    End of Session Equipment Utilized During Treatment: Gait belt Activity Tolerance: Patient limited by lethargy;Patient limited by fatigue Patient left: in bed;with nursing/sitter in room;with restraints reapplied Nurse Communication: Mobility status;Other (comment)(how session went) PT Visit Diagnosis: Muscle weakness (generalized) (M62.81);Adult, failure to thrive (R62.7);Other abnormalities of gait and mobility (R26.89);Unsteadiness on feet (R26.81)    Time: [id number] PT Time Calculation (min) (ACUTE ONLY): 23 min   Charges:   PT Evaluation $PT Eval Moderate Complexity: 1 Mod PT Treatments $Therapeutic Exercise: 8-22 mins   PT G Codes:        [name] PT, DPT, LAT, ATC  07/16/17  11:22 AM       [name], [name] 07/16/2017, 11:18 AM

## 2017-07-16 NOTE — Progress Notes (Addendum)
[facility] - Welcome at [facility]   PATIENT NAME: Brent Lewis    MR#:  [id number]  DATE OF BIRTH:  June 16, 1940  SUBJECTIVE:   Patient here with sepsis  Non verbal at baseline  Bs was low this  Rapid response called   REVIEW OF SYSTEMS:    Unable to obtain due to baseline condition    Tolerating Diet: eating very poorly      DRUG ALLERGIES:  No Known Allergies  VITALS:  Blood pressure (!) 107/56, pulse (!) 111, temperature (!) 97.4 F (36.3 C), temperature source Oral, resp. rate (!) 24, height 6\' 3"  (1.905 m), weight 52.7 kg (116 lb 2.9 oz), SpO2 100 %.  PHYSICAL EXAMINATION:  Constitutional: Appears thin frail chronically ill eppearing HENT: Normocephalic.  Eyes: Conjunctivae are normal. No scleral icterus.  Neck:there is hole/stoma CVS: RRR, S1/S2 +, no murmurs, no gallops, no carotid bruit.  Pulmonary: b/l rhonchii no wheezing Abdominal: Soft. BS +,  no distension, tenderness, rebound or guarding.  Musculoskeletal: Normal range of motion. No edema and no tenderness.  Neuro:letharigic Skin: Skin is warm and dry. No rash noted. Psychiatric: lethargic    LABORATORY PANEL:   CBC Recent Labs  Lab 07/16/17 0801  WBC 13.8*  HGB 11.9*  HCT 37.9*  PLT 58*   ------------------------------------------------------------------------------------------------------------------  Chemistries  Recent Labs  Lab Feb 11, 2018 2235  07/16/17 0801  NA 136   < > 143  K 3.7   < > 4.4  CL 102   < > 113*  CO2 22   < > 11*  GLUCOSE 106*   < > 205*  BUN 45*   < > 55*  CREATININE 1.34*   < > 2.18*  CALCIUM 8.7*   < > 9.3  AST 118*  --   --   ALT 48  --   --   ALKPHOS 224*  --   --   BILITOT 9.5*  --   --    < > = values in this interval not displayed.   ------------------------------------------------------------------------------------------------------------------  Cardiac Enzymes Recent Labs  Lab Feb 11, 2018 1310  TROPONINI 0.04*    ------------------------------------------------------------------------------------------------------------------  RADIOLOGY:  Dg Chest 2 View  Result Date: 07/15/17 CLINICAL DATA:  Possible hypoxia EXAM: CHEST - 2 VIEW COMPARISON:  01/12/2016 FINDINGS: Tracheostomy tube is been removed in the interval. Severe COPD with hyperinflation. Negative for heart failure or pneumonia. Mild bibasilar scarring. Negative for pleural effusion IMPRESSION: Severe COPD. Bibasilar scarring without acute cardiopulmonary abnormality. Electronically Signed   By: Marlan Palauharles  Clark M.D.   On: 003/22/19 13:54   Dg Chest Port 1 View  Result Date: 07/15/2017 CLINICAL DATA:  Sepsis EXAM: PORTABLE CHEST 1 VIEW COMPARISON:  003/22/19 FINDINGS: The lungs are hyperinflated likely secondary to COPD. There is hazy left lower lobe airspace disease which may reflect atelectasis versus pneumonia. There is no pleural effusion or pneumothorax. The heart and mediastinal contours are unremarkable. The osseous structures are unremarkable. IMPRESSION: 1. Hazy left lower lobe airspace disease which may reflect atelectasis versus pneumonia. 2. COPD. Electronically Signed   By: Elige KoHetal  Patel   On: 07/15/2017 14:40     ASSESSMENT AND PLAN:   77 year old male with history of chronic tracheostomy who was brought into the ER due to altered mental status.  1.  Acute metabolic encephalopathy in the setting of sepsis due to pneumonia  2.  Sepsis: Patient presents with leukocytosis and tachycardia from pneumonia Continue cefepime Discontinue vancomycin as MRSA PCR is negative Sepsis is improving  with decreasing lactic acid level and pro calcitonin levels  3.  Acute hypoxic respiratory failure  Continue management as above for pneumonia ENT consult to confirm or not confirm trach vs laryngeal stoma   4.  Hypoglycemia with hypothermia Continue bear hugger Continue D5W Continue to monitor blood sugars  6  Acute kidney injury in the  setting of sepsis and ATN with poor p.o. Intake Hold nephrotoxic medications BMP for a.m. If still elevated then will consider renal consult   7.  Thrombocytopenia: May be from sepsis Hematology evaluation No heparin or Lovenox due to low platelet count Hit antibody ordered  8. Hypothyroid; Continue Synthroid 9. HTN, essential: Continue norvasc   PT RECS SNF CSW consult placed Patient is ward of state Palliative care consult placed for Monday Continue sitter    CODE STATUS:  FULL  TOTAL TIME TAKING CARE OF THIS PATIENT: 28 minutes.     POSSIBLE D/C ??, DEPENDING ON CLINICAL CONDITION.   Mozella Rexrode M.D on 07/16/2017 at 11:41 AM  Between 7am to 6pm - Pager - [phone] After 6pm go to [email or web address] - password [email or web address]  [facility]  Office  [phone]  CC: Primary care physician; Patient, No Pcp Per  Note: This dictation was prepared with Dragon dictation along with smaller phrase technology. Any transcriptional errors that result from this process are unintentional.

## 2017-07-16 NOTE — Progress Notes (Signed)
Dr Elenore RotaJuengel paged regarding suction, orders givenr

## 2017-07-17 ENCOUNTER — Inpatient Hospital Stay: Payer: Medicare Other

## 2017-07-17 DIAGNOSIS — I469 Cardiac arrest, cause unspecified: Secondary | ICD-10-CM

## 2017-07-17 DIAGNOSIS — J9601 Acute respiratory failure with hypoxia: Secondary | ICD-10-CM

## 2017-07-17 DIAGNOSIS — J9600 Acute respiratory failure, unspecified whether with hypoxia or hypercapnia: Secondary | ICD-10-CM

## 2017-07-17 DIAGNOSIS — N289 Disorder of kidney and ureter, unspecified: Secondary | ICD-10-CM

## 2017-07-17 LAB — BLOOD GAS, ARTERIAL
ACID-BASE DEFICIT: 15 mmol/L — AB (ref 0.0–2.0)
Bicarbonate: 11.7 mmol/L — ABNORMAL LOW (ref 20.0–28.0)
FIO2: 0.21
O2 SAT: 79.8 %
PATIENT TEMPERATURE: 36
PCO2 ART: 30 mmHg — AB (ref 32.0–48.0)
PO2 ART: 51 mmHg — AB (ref 83.0–108.0)
pH, Arterial: 7.21 — ABNORMAL LOW (ref 7.350–7.450)

## 2017-07-17 LAB — GLUCOSE, CAPILLARY
GLUCOSE-CAPILLARY: 143 mg/dL — AB (ref 65–99)
GLUCOSE-CAPILLARY: 132 mg/dL — AB (ref 65–99)

## 2017-07-17 MED ORDER — NOREPINEPHRINE 4 MG/250ML-% IV SOLN
0.0000 ug/min | INTRAVENOUS | Status: DC
  Filled 2017-07-17 (×2): qty 250

## 2017-07-17 MED ORDER — SODIUM CHLORIDE 0.9 % IV BOLUS (SEPSIS): 1000.0000 mL | Freq: Once | INTRAVENOUS | Status: DC

## 2017-07-17 MED ORDER — PHENYLEPHRINE HCL 10 MG/ML IJ SOLN
0.0000 ug/min | Freq: Once | INTRAVENOUS | Status: DC
  Filled 2017-07-17: qty 4

## 2017-07-17 MED ORDER — SODIUM CHLORIDE 0.9 % IV SOLN: 0.0000 ug/min | INTRAVENOUS | Status: DC

## 2017-07-17 MED ORDER — SODIUM BICARBONATE 8.4 % IV SOLN: 50.0000 meq | Freq: Once | INTRAVENOUS | Status: DC

## 2017-07-17 MED ORDER — EPINEPHRINE PF 1 MG/10ML IJ SOSY
PREFILLED_SYRINGE | INTRAMUSCULAR | Status: AC
  Filled 2017-07-17: qty 50

## 2017-07-17 MED ORDER — SODIUM CHLORIDE 0.9 % IV SOLN
0.0000 ug/min | INTRAVENOUS | Status: DC
  Filled 2017-07-17: qty 4

## 2017-07-18 LAB — GLUCOSE, CAPILLARY: Glucose-Capillary: 21 mg/dL — CL (ref 65–99)

## 2017-07-18 LAB — HEPARIN INDUCED PLATELET AB (HIT ANTIBODY): Heparin Induced Plt Ab: 0.348 OD (ref 0.000–0.400)

## 2017-07-19 LAB — CULTURE, BLOOD (ROUTINE X 2)
Culture: NO GROWTH
Culture: NO GROWTH
Special Requests: ADEQUATE
Special Requests: ADEQUATE

## 2017-07-25 ENCOUNTER — Telehealth: Payer: Self-pay

## 2017-07-25 NOTE — Telephone Encounter (Signed)
[employer] dropped off Death Certificate to be completed and signed Placed in Nurse Box

## 2017-07-25 NOTE — Consult Note (Signed)
PULMONARY / CRITICAL CARE MEDICINE   Name: Brent Lewis MRN: [id number] DOB: 1940/11/29    ADMISSION DATE:  06/26/2017   CONSULTATION DATE:  06/27/2017  REFERRING MD:  Dr. Sheryle Hail  REASON: Decreased level of consciousness, hypotension and hypoxemia  HISTORY OF PRESENT ILLNESS:   This is a 77 year old male with a past medical history of tracheostomy and no other documented past medical history, word of the state, admitted on July 14, 2017 with altered mental status.  He was ruled in for sepsis and admitted to the floor for IV antibiotics.  A rapid response was called this morning for decreased level of consciousness hypotension and hypoxemia.  An ABG was obtained and patient was given a fluid bolus and started on pressors and transferred to the ICU for further management.  Upon arrival in the ICU, patient went into respiratory arrest followed by cardiac arrest.  Initial rhythm was PEA.  CPR was started immediately at 3:20 AM.  Patient received 1 dose of atropine, 3 epinephrines, 2 bicarbs and his levo fed was at 80 mcg/min.  He was shocked once for ventricular fibrillation.  He was resuscitated for about 12 minutes with return of spontaneous circulation.  However he did not sustain his pulse and 3:59, he became pulseless again.  CPR was initiated and patient was given 4 doses of epinephrine and 2 doses of bicarb and 2 doses of atropine.  Despite resuscitation, patient remained pulseless.  In consultation with Dr. Sheryle Hail and Dr. Katrinka Blazing from healing, resuscitative efforts were suspended and patient was pronounced at 4:10am. Derenda Fennel with department of social services was notified at 5:15 AM  PAST MEDICAL HISTORY :  He  has no past medical history on file.  PAST SURGICAL HISTORY: He  has no past surgical history on file.  No Known Allergies  No current facility-administered medications on file prior to encounter.    Current Outpatient Medications on File Prior to Encounter  Medication Sig   . amLODipine (NORVASC) 2.5 MG tablet Take 2.5 mg by mouth daily.  Marland Kitchen levothyroxine (SYNTHROID, LEVOTHROID) 100 MCG tablet Take 100 mcg by mouth daily before breakfast.  . mirtazapine (REMERON) 15 MG tablet Take 15 mg by mouth at bedtime.    FAMILY HISTORY:  His has no family status information on file.    SOCIAL HISTORY: He  reports that he has quit smoking. He has never used smokeless tobacco.  REVIEW OF SYSTEMS:   Unable to obtain as patient is unresponsive  SUBJECTIVE:   VITAL SIGNS: BP (!) 87/50   Pulse (!) 137   Temp (!) 88 F (31.1 C)   Resp (!) 49   Ht 6\' 3"  (1.905 m)   Wt 116 lb 2.9 oz (52.7 kg)   SpO2 (!) 87%   BMI 14.52 kg/m   HEMODYNAMICS:    VENTILATOR SETTINGS:    INTAKE / OUTPUT: I/O last 3 completed shifts: In: 3285 [P.O.:110; I.V.:2575; IV Piggyback:600] Out: 0   PHYSICAL EXAMINATION: General: Cachectic, appears acutely ill Neuro: Unresponsive to voice touch and  noxious stimulus, pupils dilated and fixed HEENT: Oral mucosa dry Cardiovascular:  RRR, S1-S2, no murmur regurg or gallop, +2 pulses, no edema Lungs: Breath sounds diminished bilaterally, diffuse rhonchi in anterior lung fields, no wheezing Abdomen: Nondistended, normal bowel sounds in all 4 quadrants Musculoskeletal: Positive range of motion, no deformities Skin: Warm dry with poor turgor  LABS:  BMET Recent Labs  Lab 07/09/2017 2235 07/15/17 0020 07/16/17 0801  NA 136 138 143  K  3.7 3.9 4.4  CL 102 104 113*  CO2 22 24 11*  BUN 45* 44* 55*  CREATININE 1.34* 1.26* 2.18*  GLUCOSE 106* 105* 205*    Electrolytes Recent Labs  Lab 04-04-18 2235 07/15/17 0020 07/16/17 0801  CALCIUM 8.7* 9.0 9.3    CBC Recent Labs  Lab 04-04-18 2235 07/15/17 0020 07/16/17 0801  WBC 19.7* 19.9* 13.8*  HGB 12.0* 12.1* 11.9*  HCT 37.4* 37.2* 37.9*  PLT 64* 63* 58*    Coag's Recent Labs  Lab 04-04-18 2235  APTT 40*  INR 2.32    Sepsis Markers Recent Labs  Lab 04-04-18 1310   04-04-18 2248 07/15/17 0020 07/15/17 0413 07/16/17 0801  LATICACIDVEN  --    < > 3.3* 2.1* 1.7  --   PROCALCITON 16.62  --   --  11.38  --  6.40   < > = values in this interval not displayed.    ABG Recent Labs  Lab 06/28/2017 0249  PHART 7.21*  PCO2ART 30*  PO2ART 51*    Liver Enzymes Recent Labs  Lab 04-04-18 1310 04-04-18 2235  AST 125* 118*  ALT 52 48  ALKPHOS 292* 224*  BILITOT 10.5* 9.5*  ALBUMIN 2.5* 2.2*    Cardiac Enzymes Recent Labs  Lab 04-04-18 1310  TROPONINI 0.04*    Glucose Recent Labs  Lab 07/16/17 0507 07/16/17 0518 07/16/17 0543 07/16/17 2354 06/25/2017 0207 07/11/2017 0300  GLUCAP 66 72 126* 161* 143* 132*    Imaging Dg Chest Port 1 View  Result Date: 06/24/2017 CLINICAL DATA:  Tracheostomy tube placement. Acute respiratory failure. EXAM: PORTABLE CHEST 1 VIEW COMPARISON:  Radiograph earlier this day. FINDINGS: Tracheostomy tube tip at the thoracic inlet. Shifting lung opacities with improved right basilar aeration but worsening left perihilar aeration. Increasing patchy left perihilar opacities. Probable bilateral pleural effusions. Unchanged heart size and mediastinal contours. No pneumothorax. IMPRESSION: 1. Tracheostomy tube tip at the thoracic inlet. 2. Shifting lung opacities with improved right basilar aeration and worsening left perihilar aeration, may be atelectasis or vascular. Probable small bilateral pleural effusions. Electronically Signed   By: Rubye OaksMelanie  Ehinger M.D.   On: 06/24/2017 04:14   Dg Chest Port 1 View  Result Date: 07/07/2017 CLINICAL DATA:  Shortness of breath. EXAM: PORTABLE CHEST 1 VIEW COMPARISON:  Radiograph 2 days prior 07/15/2017, also 08-25-17 FINDINGS: Progressive hazy bibasilar opacities in both lung bases. There is new vascular congestion. Chronic hyperinflation. Heart is normal in size. No pneumothorax. IMPRESSION: Progressive hazy opacity at both lung bases over the past 3 days suspicious for pleural  effusions and bibasilar atelectasis/pneumonia. Differential considerations include aspiration or fluid overload, as there is mild vascular congestion. Chronic hyperinflation. Electronically Signed   By: Rubye OaksMelanie  Ehinger M.D.   On: 07/16/2017 03:43   SIGNIFICANT EVENTS: 03/21: Admitted 03/24: Rapid response and transferred to the ICU for septic shock, acute hypoxic respiratory failure with subsequent cardiopulmonary arrest LINES/TUBES: PIVS  ASSESSMENT  Cardiopulmonary arrest Septic shock Sepsis likely due to pneumonia Acute hypoxic respiratory failure Severe malnutrition/adult failure to thrive Chronic trach PLAN Patient resuscitated per ACLS protocol.  See list of medications given and HPI.  FAMILY  - Updates: Legal guardian notified  Aissatou Fronczak S. [facility] ANP-BC Pulmonary and Critical Care Medicine [facility] Pager [phone] or [phone]  NB: This document was prepared using Dragon voice recognition software and may include unintentional dictation errors.    07/10/2017, 6:08 AM

## 2017-07-25 NOTE — Significant Event (Signed)
Rapid Response Event Note  Overview: Time Called: 0222 Arrival Time: 0225 Event Type: Respiratory, Neurologic  Initial Focused Assessment:  Nicki Guadalajararicia RN calls rapid response for decreased mentation and soft bp 90/42.  On arrival to pt room pt opens eyes to name, breathing very shallow.   Interventions: ABG ordered, MD notified, orders received for 1L bolus and Levophed gtt Plan of Care (if not transferred): Patient transported to ICU Event Summary:   at      at          [facility],Josselin Gaulin Y

## 2017-07-25 NOTE — Progress Notes (Signed)
Supervisor notified pt's condition change, unable to get rectal temp rapid response called, Dr Sheryle Hailiamond notified, orders given

## 2017-07-25 NOTE — Progress Notes (Signed)
   07/13/2017 0226  Clinical Encounter Type  Visited With Patient not available;Health care provider  Visit Type Code  Spiritual Encounters  Spiritual Needs Prayer   Chaplain responded to rapid response, maintained pastoral presence, offered silent prayers for patient and care team.  Chaplain engaged with staff, including AC.

## 2017-07-25 NOTE — Progress Notes (Signed)
Received patient from the floor as a rapid response. Patient rapidly declined once established in icu. Code was called. Patient with a stoma. 6.5 et tube insert in stoma. Patient was bagged with ambu bag. Positive color change noted with et tube placement in stoma. Bilateral breath sounds confirmed by Dr Dolores FrameSung ER MD. Pulse established. Placed #8 cuffed trach into stoma site. Positive color change noted per end tidal detector. Placed patient on vent settings tidal volume 400 RR 20 50% per NP Tukov. Patient coded again. ACLS protocols followed until code was called by Pola CornElink md and NP.

## 2017-07-25 NOTE — Death Summary Note (Signed)
DEATH SUMMARY   Patient Details  Name: Brent Lewis MRN: [id number] DOB: 10-18-1940  Admission/Discharge Information   Admit Date:  31-Jul-2017  Date of Death: Date of Death: 2017-08-03  Time of Death: Time of Death: 0410  Length of Stay: 3  Referring Physician: Patient, No Pcp Per   Reason(s) for Hospitalization  Sepsis and septic shock  Acute metabolic encephalopathy  Diagnoses  Preliminary cause of death: Cardiopulmonary arrest Secondary Diagnoses (including complications and co-morbidities):  Active Problems:   Sepsis (HCC) Acute hypoxic respiratory failure Septic shock Malnutrition/adult failure to thrive Chronic trach  Brief Hospital Course (including significant findings, care, treatment, and services provided and events leading to death)  Brent Lewis is a 77 y.o. year old male who was admitted with acute change in mental status, ruled in for sepsis and was admitted for management.  He was started on broad-spectrum antibiotics given IV fluids.  Also thrombocytopenic and his INR was elevated 2.32 upon admission.  On August 03, 2017 at about 2:30 AM, patient became acutely unresponsive, was found to be severely hypoxemic with copious amounts of secretions controlled draining out of his tracheostomy and oral cavity.  He was also hypotensive and hence was transferred to the ICU for further management.  Upon arrival in the ICU, patient went into cardiopulmonary arrest and was resuscitated from 3:20 AM until 4:10 AM when patient was pronounced.  He was a ward of the state hence the Department of Social Services was was notified.  Pertinent Labs and Studies  Significant Diagnostic Studies Dg Chest 2 View  Result Date: 07-31-17 CLINICAL DATA:  Possible hypoxia EXAM: CHEST - 2 VIEW COMPARISON:  01/12/2016 FINDINGS: Tracheostomy tube is been removed in the interval. Severe COPD with hyperinflation. Negative for heart failure or pneumonia. Mild bibasilar scarring. Negative for pleural effusion  IMPRESSION: Severe COPD. Bibasilar scarring without acute cardiopulmonary abnormality. Electronically Signed   By: Marlan Palau M.D.   On: Jul 31, 2017 13:54   Dg Chest Port 1 View  Result Date: Aug 03, 2017 CLINICAL DATA:  Tracheostomy tube placement. Acute respiratory failure. EXAM: PORTABLE CHEST 1 VIEW COMPARISON:  Radiograph earlier this day. FINDINGS: Tracheostomy tube tip at the thoracic inlet. Shifting lung opacities with improved right basilar aeration but worsening left perihilar aeration. Increasing patchy left perihilar opacities. Probable bilateral pleural effusions. Unchanged heart size and mediastinal contours. No pneumothorax. IMPRESSION: 1. Tracheostomy tube tip at the thoracic inlet. 2. Shifting lung opacities with improved right basilar aeration and worsening left perihilar aeration, may be atelectasis or vascular. Probable small bilateral pleural effusions. Electronically Signed   By: Rubye Oaks M.D.   On: 03-Aug-2017 04:14   Dg Chest Port 1 View  Result Date: 08/03/2017 CLINICAL DATA:  Shortness of breath. EXAM: PORTABLE CHEST 1 VIEW COMPARISON:  Radiograph 2 days prior 07/15/2017, also 07-31-17 FINDINGS: Progressive hazy bibasilar opacities in both lung bases. There is new vascular congestion. Chronic hyperinflation. Heart is normal in size. No pneumothorax. IMPRESSION: Progressive hazy opacity at both lung bases over the past 3 days suspicious for pleural effusions and bibasilar atelectasis/pneumonia. Differential considerations include aspiration or fluid overload, as there is mild vascular congestion. Chronic hyperinflation. Electronically Signed   By: Rubye Oaks M.D.   On: 08/03/17 03:43   Dg Chest Port 1 View  Result Date: 07/15/2017 CLINICAL DATA:  Sepsis EXAM: PORTABLE CHEST 1 VIEW COMPARISON:  2017-07-31 FINDINGS: The lungs are hyperinflated likely secondary to COPD. There is hazy left lower lobe airspace disease which may reflect atelectasis versus pneumonia.  There is  no pleural effusion or pneumothorax. The heart and mediastinal contours are unremarkable. The osseous structures are unremarkable. IMPRESSION: 1. Hazy left lower lobe airspace disease which may reflect atelectasis versus pneumonia. 2. COPD. Electronically Signed   By: Elige Ko   On: 07/15/2017 14:40    Microbiology Recent Results (from the past 240 hour(s))  Culture, blood (routine x 2)     Status: None (Preliminary result)   Collection Time: 07-24-17  3:35 PM  Result Value Ref Range Status   Specimen Description BLOOD LEFT ARM  Final   Special Requests   Final    BOTTLES DRAWN AEROBIC AND ANAEROBIC Blood Culture adequate volume   Culture   Final    NO GROWTH 3 DAYS Performed at [facility], [address] [place], [place] [address]    Report Status PENDING  Incomplete  Culture, blood (routine x 2)     Status: None (Preliminary result)   Collection Time: 24-Jul-2017  3:35 PM  Result Value Ref Range Status   Specimen Description BLOOD RIGHT ARM  Final   Special Requests   Final    BOTTLES DRAWN AEROBIC AND ANAEROBIC Blood Culture adequate volume   Culture   Final    NO GROWTH 3 DAYS Performed at [facility], [address], [place], [place] [address]    Report Status PENDING  Incomplete  MRSA PCR Screening     Status: None   Collection Time: 07/16/17 12:12 AM  Result Value Ref Range Status   MRSA by PCR NEGATIVE NEGATIVE Final    Comment:        The GeneXpert MRSA Assay (FDA approved for NASAL specimens only), is one component of a comprehensive MRSA colonization surveillance program. It is not intended to diagnose MRSA infection nor to guide or monitor treatment for MRSA infections. Performed at [facility], [address] [place], [place] [address]     Lab Basic Metabolic Panel: Recent Labs  Lab 07-24-17 1310 07-24-17 2235 07/15/17 0020 07/16/17 0801  NA 137 136 138 143  K 4.1 3.7 3.9 4.4  CL 98* 102 104 113*  CO2  26 22 24  11*  GLUCOSE 143* 106* 105* 205*  BUN 56* 45* 44* 55*  CREATININE 1.76* 1.34* 1.26* 2.18*  CALCIUM 10.4* 8.7* 9.0 9.3   Liver Function Tests: Recent Labs  Lab 24-Jul-2017 1310 07-24-17 2235  AST 125* 118*  ALT 52 48  ALKPHOS 292* 224*  BILITOT 10.5* 9.5*  PROT 7.9 7.1  ALBUMIN 2.5* 2.2*   No results for input(s): LIPASE, AMYLASE in the last 168 hours. No results for input(s): AMMONIA in the last 168 hours. CBC: Recent Labs  Lab 07/24/17 1310 07-24-2017 2235 07/15/17 0020 07/16/17 0801  WBC 22.4* 19.7* 19.9* 13.8*  NEUTROABS 20.2* 17.3* 17.8*  --   HGB 13.6 12.0* 12.1* 11.9*  HCT 41.8 37.4* 37.2* 37.9*  MCV 93.4 94.6 94.2 97.2  PLT 82* 64* 63* 58*   Cardiac Enzymes: Recent Labs  Lab July 24, 2017 1310  TROPONINI 0.04*   Sepsis Labs: Recent Labs  Lab July 24, 2017 1310  2017-07-24 1852 Jul 24, 2017 2235 07/24/2017 2248 07/15/17 0020 07/15/17 0413 07/16/17 0801  PROCALCITON 16.62  --   --   --   --  11.38  --  6.40  WBC 22.4*  --   --  19.7*  --  19.9*  --  13.8*  LATICACIDVEN  --    < > 3.0*  --  3.3* 2.1* 1.7  --    < > =  values in this interval not displayed.    Procedures/Operations  None  Lucian Baswell S. [facility] ANP-BC Pulmonary and Critical Care Medicine [facility] Pager [phone] or [phone]  NB: This document was prepared using Dragon voice recognition software and may include unintentional dictation errors.   07/16/2017, 6:22 AM

## 2017-07-25 NOTE — Progress Notes (Signed)
   05/17/2017 0326  Clinical Encounter Type  Visited With Patient not available  Visit Type Code  Spiritual Encounters  Spiritual Needs Prayer   Chaplain responded to code blue, maintained pastoral presence, offered silent prayers for patient and care team.

## 2017-07-25 NOTE — ED Provider Notes (Signed)
[facility]  Department of Emergency Medicine   Code Blue CONSULT NOTE  Chief Complaint: Cardiac arrest/unresponsive   Level V Caveat: Unresponsive  History of present illness: I was contacted by the hospital for a CODE BLUE cardiac arrest upstairs and presented to the patient's bedside.   Patient admitted for sepsis; reportedly status post laryngectomy.  Spontaneous pulse; CPR performed by staff.  ROS: Unable to obtain, Level V caveat  Scheduled Meds: . amLODipine  2.5 mg Oral Daily  . levothyroxine  100 mcg Oral QAC breakfast  . mirtazapine  15 mg Oral QHS  . pantoprazole (PROTONIX) IV  40 mg Intravenous Q24H   Continuous Infusions: . norepinephrine (LEVOPHED) Adult infusion    . piperacillin-tazobactam (ZOSYN)  IV 3.375 g (07/16/17 2156)  . sodium chloride     PRN Meds:.acetaminophen, diphenhydrAMINE, ondansetron (ZOFRAN) IV No past medical history on file. No past surgical history on file. Social History   Socioeconomic History  . Marital status: Married    Spouse name: Not on file  . Number of children: Not on file  . Years of education: Not on file  . Highest education level: Not on file  Occupational History  . Not on file  Social Needs  . Financial resource strain: Not on file  . Food insecurity:    Worry: Not on file    Inability: Not on file  . Transportation needs:    Medical: Not on file    Non-medical: Not on file  Tobacco Use  . Smoking status: Former Games developer  . Smokeless tobacco: Never Used  Substance and Sexual Activity  . Alcohol use: Not on file  . Drug use: Not on file  . Sexual activity: Not on file  Lifestyle  . Physical activity:    Days per week: Not on file    Minutes per session: Not on file  . Stress: Not on file  Relationships  . Social connections:    Talks on phone: Not on file    Gets together: Not on file    Attends religious service: Not on file    Active member of club or organization: Not on file   Attends meetings of clubs or organizations: Not on file    Relationship status: Not on file  . Intimate partner violence:    Fear of current or ex partner: Not on file    Emotionally abused: Not on file    Physically abused: Not on file    Forced sexual activity: Not on file  Other Topics Concern  . Not on file  Social History Narrative  . Not on file   No Known Allergies  Last set of Vital Signs (not current) Vitals:   07/16/17 2042 07/04/2017 0239  BP:  (!) 73/42  Pulse: 88   Resp: (!) 22   Temp:    SpO2: 98%       Physical Exam  Gen: unresponsive Cardiovascular: pulseless  Resp: apneic.  Open tracheostomy stoma.  Abd: nondistended  Neuro: GCS 3, unresponsive to pain  HEENT: No blood in posterior pharynx, gag reflex absent  Neck: No crepitus  Musculoskeletal: No deformity  Skin: warm  Procedures  INTUBATION Performed by: Respiratory therapy  required items: required blood products, implants, devices, and special equipment available Patient identity confirmed: provided demographic data and hospital-assigned identification number Time out: Immediately prior to procedure a "time out" was called to verify the correct patient, procedure, equipment, support staff and site/side marked as required. Indications: CPR  Intubation method: Trach through open stoma Post-procedure assessment: chest rise and ETCO2 monitor Breath sounds: Diminished on the right and absent over the epigastrium Tube secured by Respiratory Therapy Patient tolerated the procedure well with no immediate complications.  CRITICAL CARE Performed by: Irean HongSUNG,Yula Crotwell J Total critical care time: 10 minutes Critical care time was exclusive of separately billable procedures and treating other patients. Critical care was necessary to treat or prevent imminent or life-threatening deterioration. Critical care was time spent personally by me on the following activities: development of treatment plan with patient and/or  surrogate as well as nursing, discussions with consultants, evaluation of patient's response to treatment, examination of patient, obtaining history from patient or surrogate, ordering and performing treatments and interventions, ordering and review of laboratory studies, ordering and review of radiographic studies, pulse oximetry and re-evaluation of patient's condition.  Cardiopulmonary Resuscitation (CPR) Procedure Note  Directive/performed by hospitalist Dr. Sheryle Hailiamond and NP Maggie  Medical Decision making  77 year old male admitted for sepsis, reportedly status post laryngectomy with open tracheostomy stoma.  RT was able to place trach without difficulty.  Chest feels rigid and patient difficult to bag.  Diminished breath sounds on the right.  Recommended urgent portable chest x-ray to evaluate pneumothorax on the right.  This was communicated to CCU NP as well as hospitalist.  Assessment and Plan  CCU NP and hospitalist at bedside running the code.  Airway established by RT.  Care transferred to the above providers.   Irean HongSung, Koreena Joost J, MD 06/30/2017 [phone]

## 2017-07-25 NOTE — Progress Notes (Signed)
   10-20-2017 0400  Clinical Encounter Type  Visited With Patient not available  Visit Type Code  Spiritual Encounters  Spiritual Needs Prayer   Chaplain responded to code blue, maintained pastoral presence, offered silent prayers for patient and care team.  Patient deceased; final prayer offered.

## 2017-07-25 NOTE — Progress Notes (Signed)
(  [id number]) patient transported  by this RN to ICU post rapid response.  Hypothermic, blood pressure remains low but is responding to 1L NS bolus. Orders received for chest xray and Levophed gtt.  (0320) CODE BLUE CALLED see record. ROSC achieved.  (0400) CODE BLUE CALLED see record, patient pronounced 0410.

## 2017-07-25 NOTE — Telephone Encounter (Signed)
Death certificate placed in DS folder for completion. 

## 2017-07-25 DEATH — deceased

## 2017-07-26 NOTE — Telephone Encounter (Signed)
Death cert faxed to [employer] per their request and called and informed them it is ready for pick up.

## 2019-01-28 IMAGING — DX DG CHEST 1V PORT
1 series · 1 of 1 positions shown · non-contrast
Comparison: 07/14/2017

CLINICAL DATA: Sepsis

EXAM:
PORTABLE CHEST 1 VIEW

[chest ap]
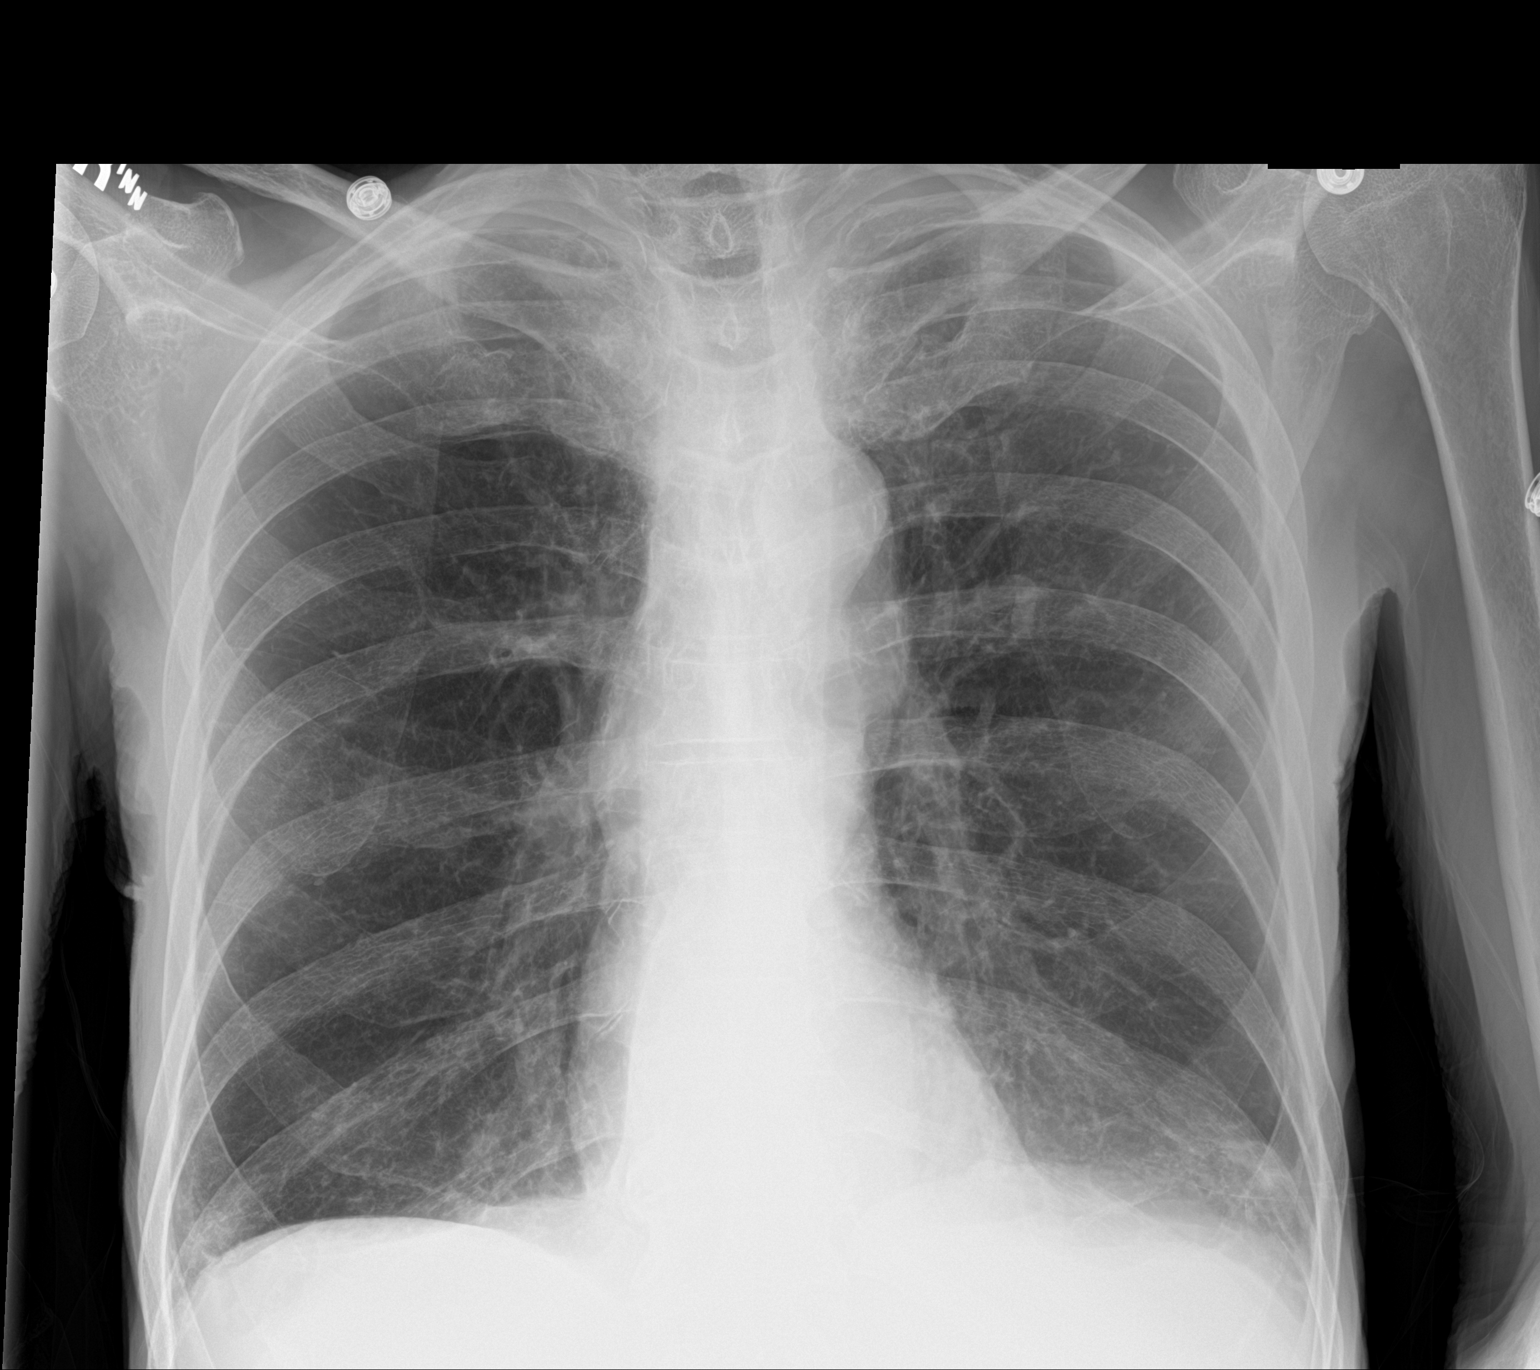

[1 of 1 positions shown; findings below may reference images not displayed]

FINDINGS: The lungs are hyperinflated likely secondary to COPD. There is hazy
left lower lobe airspace disease which may reflect atelectasis
versus pneumonia. There is no pleural effusion or pneumothorax. The
heart and mediastinal contours are unremarkable.

The osseous structures are unremarkable.
IMPRESSION: 1. Hazy left lower lobe airspace disease which may reflect
atelectasis versus pneumonia.
2. COPD.

## 2019-01-30 IMAGING — DX DG CHEST 1V PORT
1 series · 2 of 2 positions shown · non-contrast
Comparison: Radiograph 2 days prior 07/15/2017, also 07/14/2017

CLINICAL DATA: Shortness of breath.

EXAM:
PORTABLE CHEST 1 VIEW

[Series 1: chest ap · 0.14mm/px · 2 of 2 slices shown]
[im 1/2]
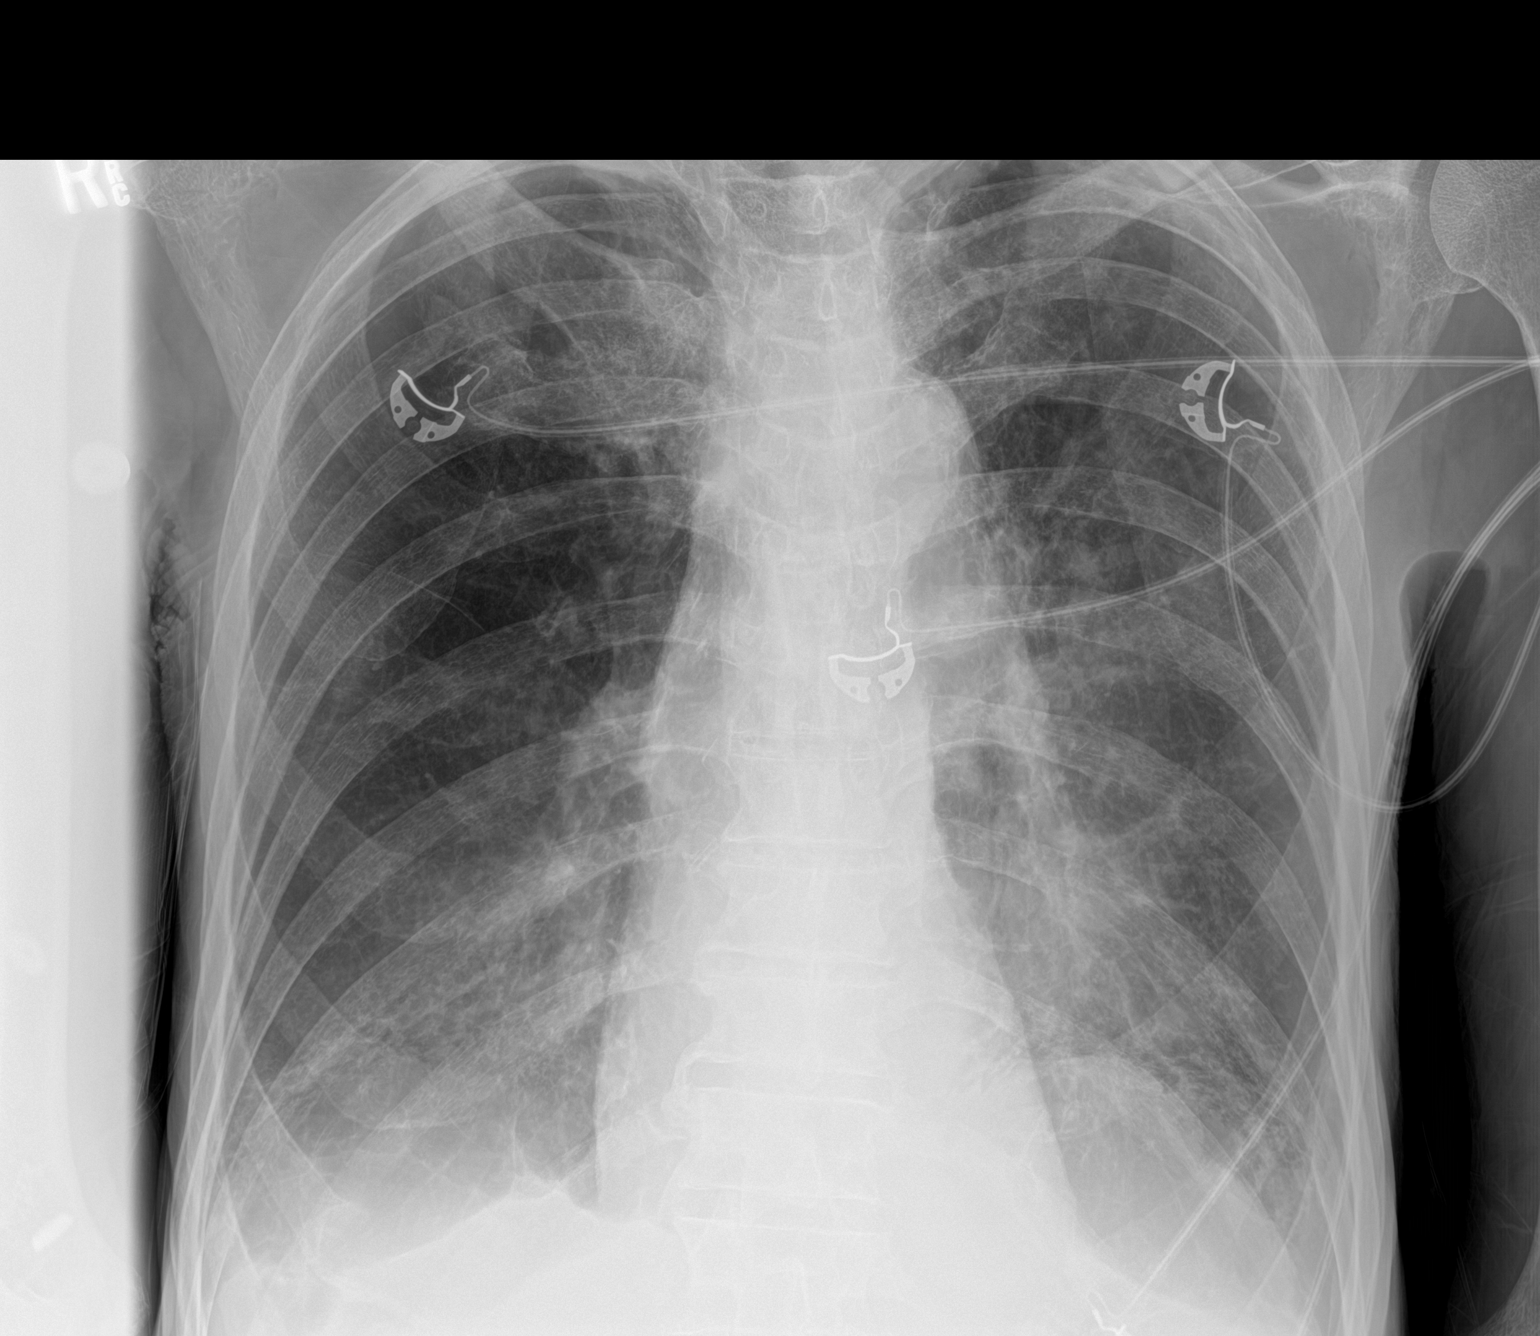
[im 2/2]
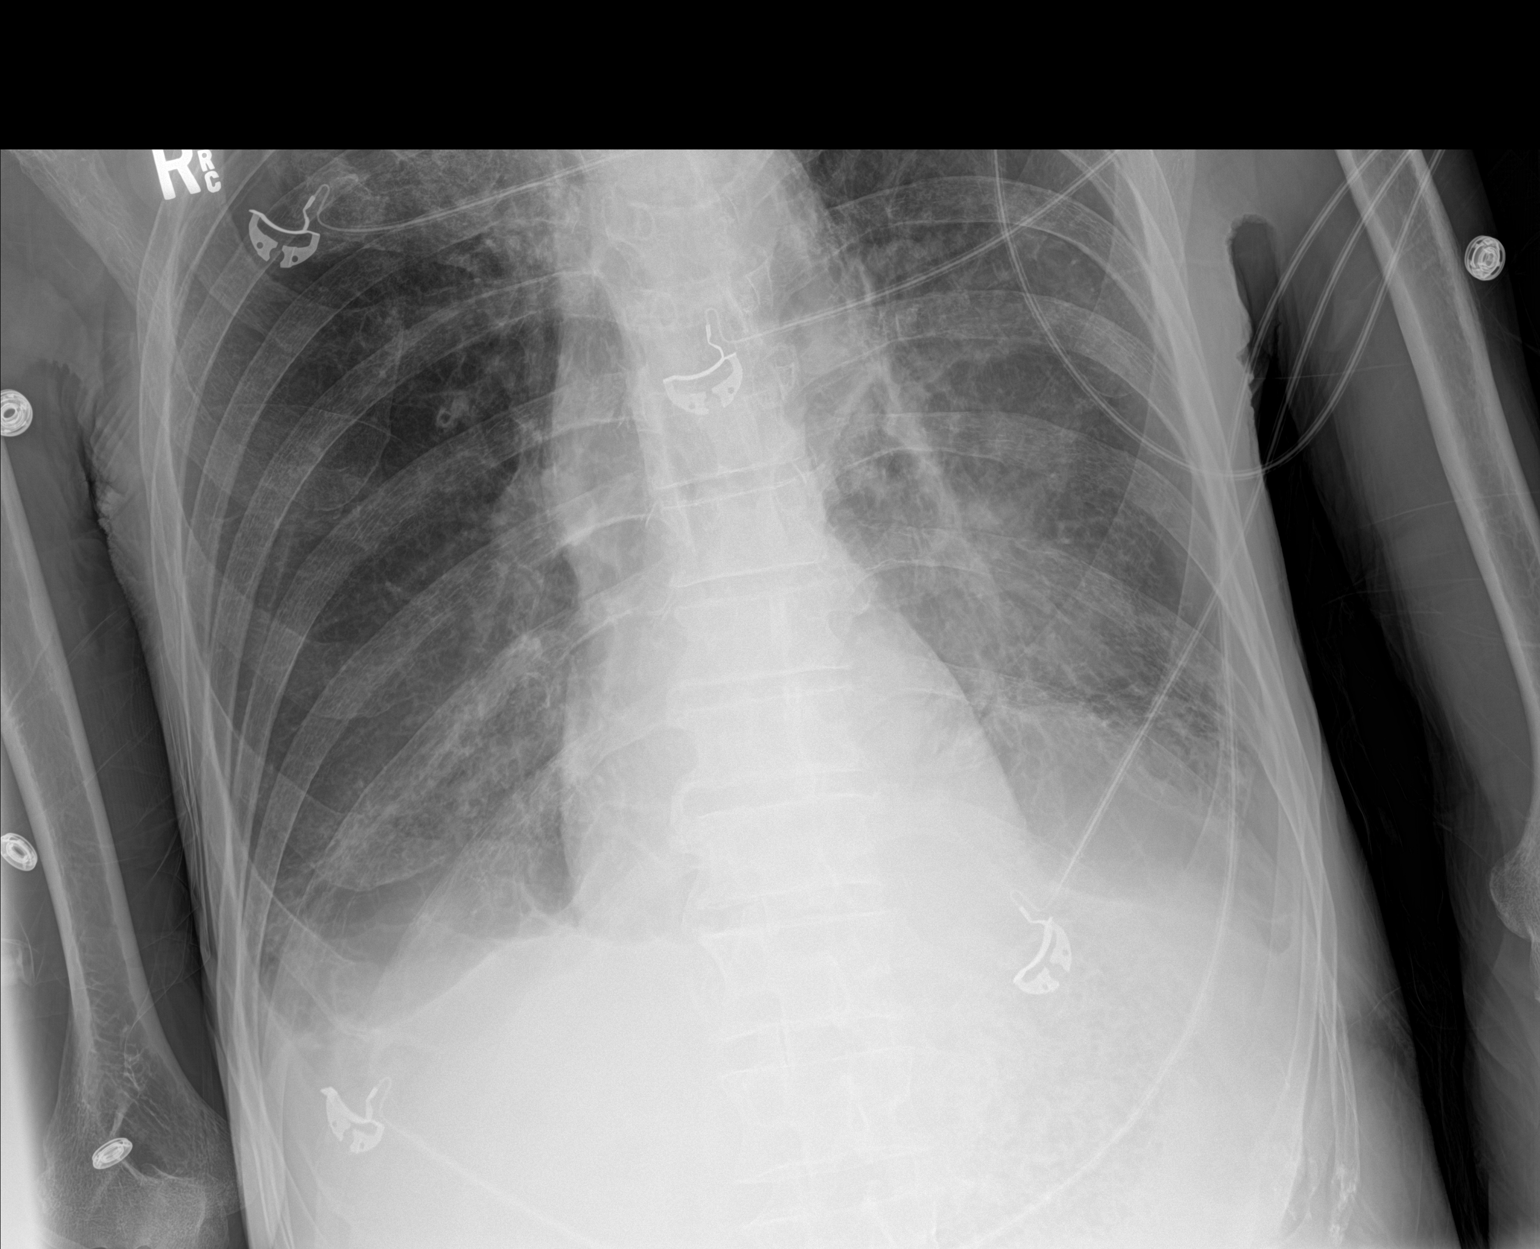

[2 of 2 positions shown; findings below may reference images not displayed]

FINDINGS: Progressive hazy bibasilar opacities in both lung bases. There is
new vascular congestion. Chronic hyperinflation. Heart is normal in
size. No pneumothorax.
IMPRESSION: Progressive hazy opacity at both lung bases over the past 3 days
suspicious for pleural effusions and bibasilar
atelectasis/pneumonia. Differential considerations include
aspiration or fluid overload, as there is mild vascular congestion.

Chronic hyperinflation.

## 2019-01-30 IMAGING — DX DG CHEST 1V PORT
1 series · 1 of 1 positions shown · non-contrast
Comparison: Radiograph earlier this day.

CLINICAL DATA: Tracheostomy tube placement. Acute respiratory
failure.

EXAM:
PORTABLE CHEST 1 VIEW

[chest ap]
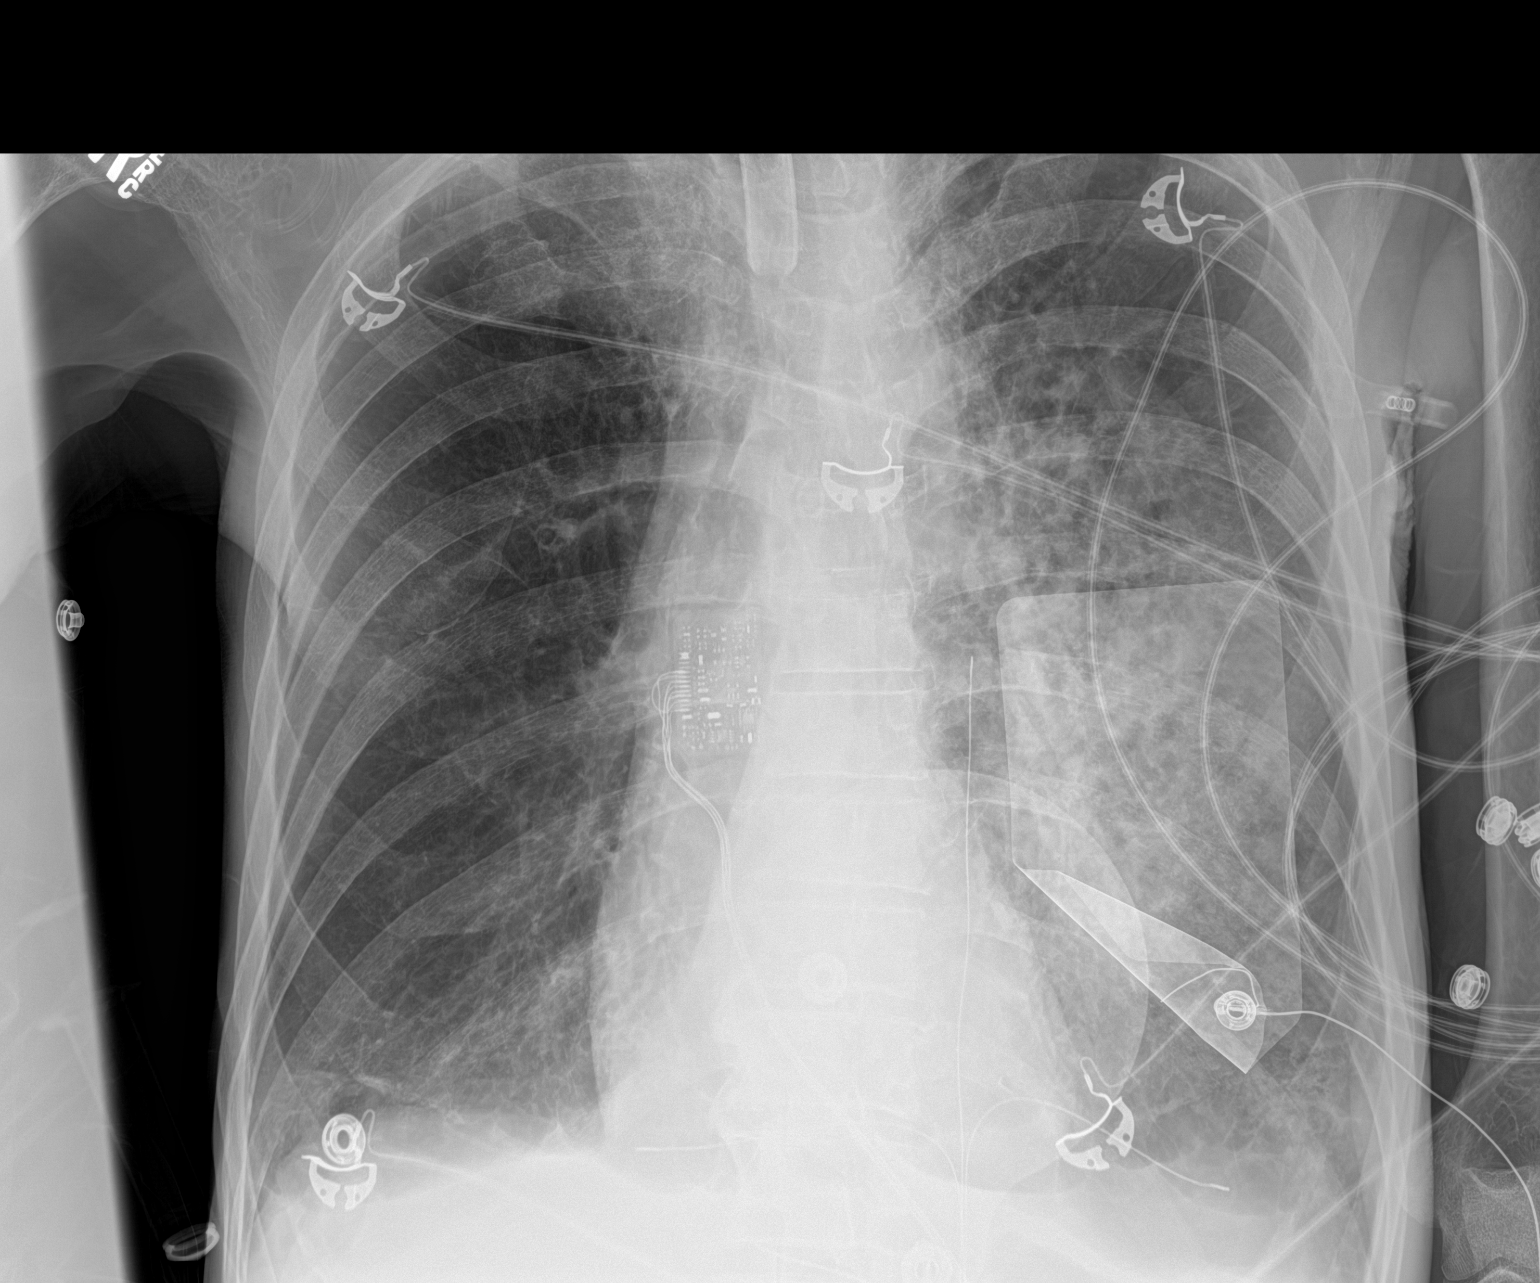

[1 of 1 positions shown; findings below may reference images not displayed]

FINDINGS: Tracheostomy tube tip at the thoracic inlet. Shifting lung opacities
with improved right basilar aeration but worsening left perihilar
aeration. Increasing patchy left perihilar opacities. Probable
bilateral pleural effusions. Unchanged heart size and mediastinal
contours. No pneumothorax.
IMPRESSION: 1. Tracheostomy tube tip at the thoracic inlet.
2. Shifting lung opacities with improved right basilar aeration and
worsening left perihilar aeration, may be atelectasis or vascular.
Probable small bilateral pleural effusions.
